# Patient Record
Sex: Male | Born: 1945 | Race: White | Hispanic: No | Marital: Married | Smoking: Former smoker
Health system: Southern US, Community
[De-identification: ages and names within clinical notes are randomized; demographics above are authoritative.]

## PROBLEM LIST (undated history)

## (undated) DIAGNOSIS — E785 Hyperlipidemia, unspecified: Secondary | ICD-10-CM

## (undated) DIAGNOSIS — F419 Anxiety disorder, unspecified: Secondary | ICD-10-CM

## (undated) DIAGNOSIS — F32A Depression, unspecified: Secondary | ICD-10-CM

## (undated) DIAGNOSIS — IMO0001 Reserved for inherently not codable concepts without codable children: Secondary | ICD-10-CM

## (undated) DIAGNOSIS — F329 Major depressive disorder, single episode, unspecified: Secondary | ICD-10-CM

## (undated) DIAGNOSIS — C801 Malignant (primary) neoplasm, unspecified: Secondary | ICD-10-CM

## (undated) DIAGNOSIS — H9319 Tinnitus, unspecified ear: Secondary | ICD-10-CM

## (undated) HISTORY — DX: Tinnitus, unspecified ear: H93.19

## (undated) HISTORY — DX: Depression, unspecified: F32.A

## (undated) HISTORY — DX: Anxiety disorder, unspecified: F41.9

## (undated) HISTORY — DX: Major depressive disorder, single episode, unspecified: F32.9

---

## 2007-09-06 ENCOUNTER — Ambulatory Visit (HOSPITAL_COMMUNITY): Payer: Self-pay | Admitting: Psychiatry

## 2007-09-19 ENCOUNTER — Ambulatory Visit (HOSPITAL_COMMUNITY): Payer: Self-pay | Admitting: Psychology

## 2007-09-26 ENCOUNTER — Ambulatory Visit (HOSPITAL_COMMUNITY): Payer: Self-pay | Admitting: Psychology

## 2007-10-04 ENCOUNTER — Ambulatory Visit (HOSPITAL_COMMUNITY): Payer: Self-pay | Admitting: Psychiatry

## 2007-10-12 ENCOUNTER — Ambulatory Visit (HOSPITAL_COMMUNITY): Payer: Self-pay | Admitting: Psychology

## 2007-10-24 ENCOUNTER — Ambulatory Visit (HOSPITAL_COMMUNITY): Payer: Self-pay | Admitting: Psychology

## 2007-12-06 ENCOUNTER — Ambulatory Visit (HOSPITAL_COMMUNITY): Payer: Self-pay | Admitting: Psychiatry

## 2007-12-19 ENCOUNTER — Ambulatory Visit (HOSPITAL_COMMUNITY): Payer: Self-pay | Admitting: Psychology

## 2008-01-17 ENCOUNTER — Ambulatory Visit (HOSPITAL_COMMUNITY): Payer: Self-pay | Admitting: Psychiatry

## 2008-03-18 ENCOUNTER — Ambulatory Visit (HOSPITAL_COMMUNITY): Payer: Self-pay | Admitting: Psychiatry

## 2008-05-12 ENCOUNTER — Ambulatory Visit (HOSPITAL_COMMUNITY): Payer: Self-pay | Admitting: Psychiatry

## 2008-08-12 ENCOUNTER — Ambulatory Visit (HOSPITAL_COMMUNITY): Payer: Self-pay | Admitting: Psychiatry

## 2008-09-12 ENCOUNTER — Ambulatory Visit: Payer: Self-pay | Admitting: Family Medicine

## 2008-09-12 DIAGNOSIS — K0501 Acute gingivitis, non-plaque induced: Secondary | ICD-10-CM | POA: Insufficient documentation

## 2008-10-30 ENCOUNTER — Ambulatory Visit (HOSPITAL_COMMUNITY): Payer: Self-pay | Admitting: Psychiatry

## 2008-12-29 ENCOUNTER — Ambulatory Visit (HOSPITAL_COMMUNITY): Payer: Self-pay | Admitting: Psychiatry

## 2009-02-20 ENCOUNTER — Ambulatory Visit (HOSPITAL_COMMUNITY): Payer: Self-pay | Admitting: Psychiatry

## 2009-04-23 ENCOUNTER — Ambulatory Visit (HOSPITAL_COMMUNITY): Payer: Self-pay | Admitting: Psychiatry

## 2009-07-09 ENCOUNTER — Ambulatory Visit (HOSPITAL_COMMUNITY): Payer: Self-pay | Admitting: Psychiatry

## 2009-08-06 ENCOUNTER — Ambulatory Visit (HOSPITAL_COMMUNITY): Payer: Self-pay | Admitting: Psychiatry

## 2009-09-04 ENCOUNTER — Ambulatory Visit (HOSPITAL_COMMUNITY): Payer: Self-pay | Admitting: Psychiatry

## 2009-10-27 ENCOUNTER — Ambulatory Visit (HOSPITAL_COMMUNITY): Payer: Self-pay | Admitting: Psychiatry

## 2009-12-09 ENCOUNTER — Ambulatory Visit (HOSPITAL_COMMUNITY): Payer: Self-pay | Admitting: Psychiatry

## 2010-02-02 ENCOUNTER — Ambulatory Visit (HOSPITAL_COMMUNITY): Payer: Self-pay | Admitting: Psychiatry

## 2010-04-26 ENCOUNTER — Encounter (INDEPENDENT_AMBULATORY_CARE_PROVIDER_SITE_OTHER): Payer: BC Managed Care – PPO | Admitting: Psychiatry

## 2010-04-26 DIAGNOSIS — F339 Major depressive disorder, recurrent, unspecified: Secondary | ICD-10-CM

## 2010-04-26 DIAGNOSIS — F411 Generalized anxiety disorder: Secondary | ICD-10-CM

## 2010-06-25 ENCOUNTER — Encounter (INDEPENDENT_AMBULATORY_CARE_PROVIDER_SITE_OTHER): Payer: BC Managed Care – PPO | Admitting: Psychiatry

## 2010-06-25 DIAGNOSIS — F339 Major depressive disorder, recurrent, unspecified: Secondary | ICD-10-CM

## 2010-06-25 DIAGNOSIS — F411 Generalized anxiety disorder: Secondary | ICD-10-CM

## 2010-07-01 ENCOUNTER — Other Ambulatory Visit: Payer: Self-pay | Admitting: Orthopedic Surgery

## 2010-07-01 DIAGNOSIS — M79641 Pain in right hand: Secondary | ICD-10-CM

## 2010-07-02 ENCOUNTER — Ambulatory Visit
Admission: RE | Admit: 2010-07-02 | Discharge: 2010-07-02 | Disposition: A | Discharge status: Home / Self Care | Payer: BC Managed Care – PPO | Source: Ambulatory Visit | Attending: Orthopedic Surgery | Admitting: Orthopedic Surgery

## 2010-07-02 DIAGNOSIS — M79641 Pain in right hand: Secondary | ICD-10-CM

## 2010-09-21 ENCOUNTER — Encounter (INDEPENDENT_AMBULATORY_CARE_PROVIDER_SITE_OTHER): Payer: PRIVATE HEALTH INSURANCE | Admitting: Psychiatry

## 2010-09-21 DIAGNOSIS — F429 Obsessive-compulsive disorder, unspecified: Secondary | ICD-10-CM

## 2010-09-21 DIAGNOSIS — F339 Major depressive disorder, recurrent, unspecified: Secondary | ICD-10-CM

## 2010-11-23 ENCOUNTER — Encounter (INDEPENDENT_AMBULATORY_CARE_PROVIDER_SITE_OTHER): Payer: PRIVATE HEALTH INSURANCE | Admitting: Psychiatry

## 2010-11-23 DIAGNOSIS — F411 Generalized anxiety disorder: Secondary | ICD-10-CM

## 2010-11-23 DIAGNOSIS — F339 Major depressive disorder, recurrent, unspecified: Secondary | ICD-10-CM

## 2011-01-22 ENCOUNTER — Emergency Department (INDEPENDENT_AMBULATORY_CARE_PROVIDER_SITE_OTHER)
Admission: EM | Admit: 2011-01-22 | Discharge: 2011-01-22 | Disposition: A | Discharge status: Home / Self Care | Payer: Medicare Other | Source: Home / Self Care | Attending: Family Medicine | Admitting: Family Medicine

## 2011-01-22 ENCOUNTER — Encounter: Payer: Self-pay | Admitting: Emergency Medicine

## 2011-01-22 DIAGNOSIS — J0100 Acute maxillary sinusitis, unspecified: Secondary | ICD-10-CM

## 2011-01-22 DIAGNOSIS — J069 Acute upper respiratory infection, unspecified: Secondary | ICD-10-CM

## 2011-01-22 HISTORY — DX: Depression, unspecified: F32.A

## 2011-01-22 HISTORY — DX: Hyperlipidemia, unspecified: E78.5

## 2011-01-22 HISTORY — DX: Malignant (primary) neoplasm, unspecified: C80.1

## 2011-01-22 HISTORY — DX: Anxiety disorder, unspecified: F41.9

## 2011-01-22 HISTORY — DX: Reserved for inherently not codable concepts without codable children: IMO0001

## 2011-01-22 HISTORY — DX: Major depressive disorder, single episode, unspecified: F32.9

## 2011-01-22 MED ORDER — BENZONATATE 200 MG PO CAPS: 200.0000 mg | ORAL_CAPSULE | Freq: Every day | ORAL | Status: AC

## 2011-01-22 MED ORDER — AMOXICILLIN 875 MG PO TABS: 875.0000 mg | ORAL_TABLET | Freq: Two times a day (BID) | ORAL | Status: AC

## 2011-01-22 NOTE — ED Notes (Signed)
Sore throat, headache, sinus pain x 4 days. Did have Flu shot 7 days ago. Took Robitussin and Claritan today.

## 2011-01-22 NOTE — ED Provider Notes (Signed)
History     CSN: [id number] Arrival date & time: 01/22/2011 11:19 AM   First MD Initiated Contact with Patient 01/22/11 1119      Chief Complaint  Patient presents with  . Sore Throat    headache, sinus pain x 4 days. Did have Flu shot 7 days ago. Took      HPI Comments: Patient complains of onset of sinus congestion about 3 weeks ago, and over the past 4 days has developed increased sore throat and cough.  He developed chills last night. He had a pneumococcal vaccine injection about two weeks ago and a flu shot 1 week ago  Patient is a 65 y.o. male presenting with URI. The history is provided by the patient.  URI The primary symptoms include fatigue, sore throat and cough. Primary symptoms do not include headaches, ear pain, wheezing, abdominal pain, nausea, arthralgias or rash. The current episode started more than 1 week ago. This is a new problem.  Symptoms associated with the illness include chills, facial pain, sinus pressure, congestion and rhinorrhea.    Past Medical History  Diagnosis Date  . Cancer   . Hyperlipemia   . Anxiety and depression   . White coat hypertension     History reviewed. No pertinent past surgical history.  Family History  Problem Relation Age of Onset  . Cancer Mother   . Hypertension Father   . Hyperlipidemia Father   . Hypertension Brother     History  Substance Use Topics  . Smoking status: Not on file  . Smokeless tobacco: Not on file  . Alcohol Use: No      Review of Systems  Constitutional: Positive for chills and fatigue.  HENT: Positive for congestion, sore throat, rhinorrhea and sinus pressure. Negative for ear pain.   Eyes: Negative.   Respiratory: Positive for cough. Negative for chest tightness, shortness of breath and wheezing.   Gastrointestinal: Negative for nausea and abdominal pain.  Genitourinary: Negative.   Musculoskeletal: Negative for arthralgias.  Skin: Negative for rash.  Neurological: Negative for  headaches.    Allergies  Mevacor  Home Medications   Current Outpatient Rx  Name Route Sig Dispense Refill  . ATENOLOL 25 MG PO TABS Oral Take 25 mg by mouth daily.      . ATORVASTATIN CALCIUM 40 MG PO TABS Oral Take 40 mg by mouth daily.      Marland Kitchen CLONAZEPAM 2 MG PO TABS Oral Take 10 mg by mouth 1 day or 1 dose.      Marland Kitchen ESZOPICLONE 1 MG PO TABS Oral Take 3 mg by mouth at bedtime. Take immediately before bedtime     . FLUOXETINE HCL 20 MG PO CAPS Oral Take 20 mg by mouth daily.      Marland Kitchen LAMOTRIGINE 100 MG PO TABS Oral Take 100 mg by mouth daily.      Marland Kitchen LOSARTAN POTASSIUM-HCTZ 100-12.5 MG PO TABS Oral Take 1 tablet by mouth daily.      . AMOXICILLIN 875 MG PO TABS Oral Take 1 tablet (875 mg total) by mouth 2 (two) times daily. 20 tablet 0  . BENZONATATE 200 MG PO CAPS Oral Take 1 capsule (200 mg total) by mouth at bedtime. 12 capsule 0    BP 165/75  Pulse 60  Temp(Src) 98.4 F (36.9 C) (Oral)  Resp 16  Ht 6\' 2"  (1.88 m)  Wt 244 lb (110.678 kg)  BMI 31.33 kg/m2  SpO2 98%  Physical Exam  Nursing note and  vitals reviewed. Constitutional: He is oriented to person, place, and time. He appears well-developed and well-nourished. No distress.  HENT:  Head: Normocephalic and atraumatic.  Right Ear: Tympanic membrane, external ear and ear canal normal.  Left Ear: Tympanic membrane, external ear and ear canal normal.  Nose: Mucosal edema and rhinorrhea present. Right sinus exhibits maxillary sinus tenderness. Left sinus exhibits maxillary sinus tenderness.  Mouth/Throat: Oropharynx is clear and moist. No oral lesions. No oropharyngeal exudate.  Eyes: Conjunctivae are normal. Pupils are equal, round, and reactive to light. Right eye exhibits no discharge. Left eye exhibits no discharge. No scleral icterus.  Neck: Neck supple.  Cardiovascular: Normal rate, regular rhythm and normal heart sounds.   Pulmonary/Chest: Effort normal and breath sounds normal. He has no wheezes. He has no rales.    Abdominal: Soft. There is no tenderness.  Lymphadenopathy:    He has no cervical adenopathy.  Neurological: He is alert and oriented to person, place, and time.  Skin: Skin is warm and dry.    ED Course  Procedures  None   Labs Reviewed  POCT RAPID STREP A (OFFICE) Negative      1. Acute upper respiratory infections of unspecified site   2. Acute maxillary sinusitis       MDM  Suspect prolonged viral URI with secondary sinusitis  Begin Amoxicillin.  Begin expectorant, topical decongestant, cough suppressant at bedtime.  Increase fluid intake, rest.  Followup with PCP if not improving one week       Donna Christen, MD 01/24/11 2245

## 2011-01-31 ENCOUNTER — Other Ambulatory Visit (HOSPITAL_COMMUNITY): Payer: Self-pay | Admitting: Psychiatry

## 2011-01-31 DIAGNOSIS — F329 Major depressive disorder, single episode, unspecified: Secondary | ICD-10-CM

## 2011-01-31 MED ORDER — LAMOTRIGINE 100 MG PO TABS: 100.0000 mg | ORAL_TABLET | Freq: Every day | ORAL | Status: DC

## 2011-02-07 DIAGNOSIS — I2510 Atherosclerotic heart disease of native coronary artery without angina pectoris: Secondary | ICD-10-CM | POA: Insufficient documentation

## 2011-02-18 ENCOUNTER — Encounter (HOSPITAL_COMMUNITY): Payer: Self-pay

## 2011-02-22 ENCOUNTER — Ambulatory Visit (INDEPENDENT_AMBULATORY_CARE_PROVIDER_SITE_OTHER): Payer: Medicare Other | Admitting: Psychiatry

## 2011-02-22 ENCOUNTER — Encounter (HOSPITAL_COMMUNITY): Payer: Self-pay | Admitting: Psychiatry

## 2011-02-22 VITALS — BP 148/89 | Ht 73.0 in | Wt 248.0 lb

## 2011-02-22 DIAGNOSIS — F329 Major depressive disorder, single episode, unspecified: Secondary | ICD-10-CM

## 2011-02-22 DIAGNOSIS — F410 Panic disorder [episodic paroxysmal anxiety] without agoraphobia: Secondary | ICD-10-CM

## 2011-02-22 MED ORDER — RISPERIDONE 0.25 MG PO TABS: 0.2500 mg | ORAL_TABLET | Freq: Two times a day (BID) | ORAL | Status: AC

## 2011-02-22 NOTE — Progress Notes (Signed)
   [facility] Follow-up Outpatient Visit  Austin Shaffer May 30, 1945   Subjective: The patient is a 65 year old male who has been followed by [facility] since June of 2009. He is currently diagnosed with major depression and panic disorder. He has been on multiple medication trials. He has severe recurrent depression. He is even tried ECT with no effect. The patient comes in today and states that he has had a terrible last 90 days. He's been very aggravated. He hasn't wanted to leave the house. He thinks about dying, but has no suicidal intent. He had a stress test recently secondary to daily fatigue. This was negative. Has continued on his blood pressure medications. Patient states he has enjoyed the holiday since 1989 with his depression started. He sleeps well with Lunesta. Appetite is good.  Filed Vitals:   02/22/11 0959  BP: 148/89    Mental Status Examination  Appearance: Casually dressed Alert: Yes Attention: good  Cooperative: Yes Eye Contact: Good Speech: Regular rate rhythm and volume Psychomotor Activity: Normal Memory/Concentration: Intact Oriented: person, place, time/date and situation Mood: Depressed Affect: Restricted Thought Processes and Associations: Logical Fund of Knowledge: Fair Thought Content: No suicidal or homicidal thoughts Insight: Fair Judgement: Fair  Diagnosis: Maj. 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 The patient has been on Abilify before which cause activation. I will try a quarter milligram of Risperdal twice a day, along with patient's current medications. Patient is to give me an update on Friday. I will see him back in clinic in one month.  Jamse Mead, MD

## 2011-03-25 ENCOUNTER — Encounter (HOSPITAL_COMMUNITY): Payer: Self-pay | Admitting: Psychiatry

## 2011-03-25 ENCOUNTER — Ambulatory Visit (INDEPENDENT_AMBULATORY_CARE_PROVIDER_SITE_OTHER): Payer: Medicare Other | Admitting: Psychiatry

## 2011-03-25 VITALS — BP 128/75 | Ht 73.0 in | Wt 247.0 lb

## 2011-03-25 DIAGNOSIS — F329 Major depressive disorder, single episode, unspecified: Secondary | ICD-10-CM

## 2011-03-25 NOTE — Progress Notes (Signed)
   [facility] Follow-up Outpatient Visit  Austin Shaffer 1945-12-28   Subjective: Patient seen today for followup. At last appointment he was severely depressed and had been for several months. I put him on low dose Risperdal twice a day. The patient reports today that he took it for one week. He states that his bad mood broke. He's been feeling better since. He states that he took the morning dose and didn't have any problems but after he took the evening dose he couldn't sleep at night. He does find that he sits around a little bit more during the day, but he is okay with this. He doesn't feel that he does irritable. He doesn't know if it's the medication or the holidays being over that made him feel better. He is sleeping a little bit better. He is spending time outside and getting things done. Filed Vitals:   03/25/11 1024  BP: 128/75    Mental Status Examination  Appearance: Casual Alert: Yes Attention: good  Cooperative: Yes Eye Contact: Good Speech: Regular rate rhythm and volume Psychomotor Activity: Normal Memory/Concentration: Intact Oriented: person, place, time/date and situation Mood: Euthymic Affect: Restricted Thought Processes and Associations: Logical Fund of Knowledge: Fair Thought Content: No suicidal or homicidal thoughts Insight: Fair Judgement: Fair  Diagnosis: Maj. depressive disorder, recurrent, severe without psychosis  Treatment Plan: At this point we will not continue the Risperdal for now. We will keep it available for when patient hits low moods. We will continue his other medications. I will see him back in 2 months.  Jamse Mead, MD

## 2011-04-15 ENCOUNTER — Other Ambulatory Visit: Payer: Self-pay | Admitting: Orthopedic Surgery

## 2011-04-15 ENCOUNTER — Ambulatory Visit
Admission: RE | Admit: 2011-04-15 | Discharge: 2011-04-15 | Disposition: A | Discharge status: Home / Self Care | Payer: Medicare Other | Source: Ambulatory Visit | Attending: Orthopedic Surgery | Admitting: Orthopedic Surgery

## 2011-04-15 DIAGNOSIS — M25562 Pain in left knee: Secondary | ICD-10-CM

## 2011-04-18 ENCOUNTER — Other Ambulatory Visit (HOSPITAL_COMMUNITY): Payer: Self-pay | Admitting: Psychiatry

## 2011-04-18 DIAGNOSIS — F410 Panic disorder [episodic paroxysmal anxiety] without agoraphobia: Secondary | ICD-10-CM

## 2011-04-18 DIAGNOSIS — F329 Major depressive disorder, single episode, unspecified: Secondary | ICD-10-CM

## 2011-04-18 MED ORDER — FLUOXETINE HCL 20 MG PO CAPS: 60.0000 mg | ORAL_CAPSULE | Freq: Every day | ORAL | Status: DC

## 2011-05-17 ENCOUNTER — Other Ambulatory Visit (HOSPITAL_COMMUNITY): Payer: Self-pay | Admitting: Psychiatry

## 2011-05-17 MED ORDER — CLONAZEPAM 1 MG PO TABS: 1.0000 mg | ORAL_TABLET | Freq: Every evening | ORAL | Status: DC | PRN

## 2011-05-25 ENCOUNTER — Encounter (HOSPITAL_COMMUNITY): Payer: Self-pay | Admitting: Psychiatry

## 2011-05-25 ENCOUNTER — Ambulatory Visit (INDEPENDENT_AMBULATORY_CARE_PROVIDER_SITE_OTHER): Payer: Medicare Other | Admitting: Psychiatry

## 2011-05-25 VITALS — BP 148/78 | Ht 73.0 in | Wt 252.0 lb

## 2011-05-25 DIAGNOSIS — F329 Major depressive disorder, single episode, unspecified: Secondary | ICD-10-CM

## 2011-05-25 NOTE — Progress Notes (Signed)
   [facility] Follow-up Outpatient Visit  KALIEB FREELAND June 02, 1945   Subjective: The patient is a 66 year old male who has been treated by [facility] since June of 2009. He is currently diagnosed with major depressive disorder, recurrent, moderate. Patient seen today for followup. The patient reports he continues to the door of the house. He still has pretty continuous back pain, and therefore has not been doing much physical work. He did have a injection of steroids into his knee for his pain, but it did not seem to help. The patient has been watching his food intake. He is eating healthier, but still tends to overeat. He has not had any more issues with his neighbor. His neighbor did set up cameras on the perimeter of the property that the patient has been not near them. The patient reports he hasn't been sleeping well for about 3-4 weeks. He has changed his medicine a little bit where he moved his Prozac to 3:00 in the afternoon from the morning. He also did this about 3 weeks ago. The patient's complaint that easy bruising. He states he had blood work and his primary care physician's approximately a week ago and everything was fine. Filed Vitals:   05/25/11 1343  BP: 148/78    Mental Status Examination  Appearance: Casual Alert: Yes Attention: good  Cooperative: Yes Eye Contact: Good Speech: Regular rate rhythm and volume Psychomotor Activity: Normal Memory/Concentration: Intact Oriented: person, place, time/date and situation Mood: Euthymic Affect: Restricted Thought Processes and Associations: Logical Fund of Knowledge: Fair Thought Content: No suicidal or homicidal thoughts Insight: Fair Judgement: Fair  Diagnosis: Maj. depressive disorder, recurrent, severe without psychosis  Treatment Plan: At this point we will not continue the Risperdal for now. We will keep it available for when patient hits low moods. We will continue his other medications. I  will see him back in 2 months.  Jamse Mead, MD

## 2011-07-05 ENCOUNTER — Other Ambulatory Visit (HOSPITAL_COMMUNITY): Payer: Self-pay | Admitting: Psychiatry

## 2011-07-05 MED ORDER — ESZOPICLONE 1 MG PO TABS: 3.0000 mg | ORAL_TABLET | Freq: Every day | ORAL | Status: DC

## 2011-07-19 ENCOUNTER — Telehealth (HOSPITAL_COMMUNITY): Payer: Self-pay

## 2011-07-19 MED ORDER — CLONAZEPAM 1 MG PO TABS: 1.0000 mg | ORAL_TABLET | Freq: Two times a day (BID) | ORAL | Status: DC | PRN

## 2011-07-19 NOTE — Telephone Encounter (Signed)
Is only taking 2 prozac a day now and taking 2 klonopin a day one qam and qpm. Feels this is working better. Is this ok...Marland KitchenMarland Kitchen

## 2011-07-19 NOTE — Telephone Encounter (Signed)
I am fine with it

## 2011-07-25 ENCOUNTER — Encounter (HOSPITAL_COMMUNITY): Payer: Self-pay | Admitting: Psychiatry

## 2011-07-25 ENCOUNTER — Ambulatory Visit (INDEPENDENT_AMBULATORY_CARE_PROVIDER_SITE_OTHER): Payer: PRIVATE HEALTH INSURANCE | Admitting: Psychiatry

## 2011-07-25 VITALS — BP 142/78 | Ht 73.0 in | Wt 253.0 lb

## 2011-07-25 DIAGNOSIS — F329 Major depressive disorder, single episode, unspecified: Secondary | ICD-10-CM

## 2011-07-25 DIAGNOSIS — F332 Major depressive disorder, recurrent severe without psychotic features: Secondary | ICD-10-CM

## 2011-07-25 NOTE — Progress Notes (Signed)
   Austin Shaffer Follow-up Outpatient Visit  Austin Shaffer 1946-01-22   Subjective: The patient is a 66 year old male who has been treated by [facility] since June of 2009. He is currently diagnosed with major depressive disorder, recurrent, moderate. Patient seen today for followup. At his last appointment, we discontinued his Risperdal, which seem to get him out of his funk. He was advised to keep it in the back cupboard in case he needed it. The patient called on 07/19/2011 and stated that he been taking Klonopin twice a day rather than once a day. He felt that it was helping him with his depression. He reports today that he hasn't taken the morning Klonopin for 2 days. It was making him tired. He still continues to take to Prozac in the morning. His depression is better, but he still doesn't feel a lot of motivation. This morning he only took half of his Lamictal. I have counseled him on changing his medication without discussing with me. He is sleeping better. He has stopped taking NyQuil at night. He states that he has a lot to do, but can't seem to get it done. He has not [employer]. He states that he works more spur of the moment. He denies any thoughts of self-harm. Filed Vitals:   07/25/11 1020  BP: 142/78    Mental Status Examination  Appearance: Casual Alert: Yes Attention: good  Cooperative: Yes Eye Contact: Good Speech: Regular rate rhythm and volume Psychomotor Activity: Normal Memory/Concentration: Intact Oriented: person, place, time/date and situation Mood: Euthymic Affect: Restricted Thought Processes and Associations: Logical Fund of Knowledge: Fair Thought Content: No suicidal or homicidal thoughts Insight: Fair Judgement: Fair  Diagnosis: Maj. depressive disorder, recurrent, severe without psychosis  Treatment Plan: I have recommended the patient take one half Klonopin in the morning. I want him taking a full and mental. We will  continue all of her medication. I will see him back in 2 months.  Jamse Mead, MD

## 2011-09-26 ENCOUNTER — Ambulatory Visit (HOSPITAL_COMMUNITY): Payer: Self-pay | Admitting: Psychiatry

## 2011-09-27 ENCOUNTER — Encounter (HOSPITAL_COMMUNITY): Payer: Self-pay | Admitting: Psychiatry

## 2011-09-27 ENCOUNTER — Ambulatory Visit (INDEPENDENT_AMBULATORY_CARE_PROVIDER_SITE_OTHER): Payer: Medicare Other | Admitting: Psychiatry

## 2011-09-27 VITALS — BP 138/80 | Ht 73.0 in | Wt 256.0 lb

## 2011-09-27 DIAGNOSIS — F332 Major depressive disorder, recurrent severe without psychotic features: Secondary | ICD-10-CM

## 2011-09-27 DIAGNOSIS — F329 Major depressive disorder, single episode, unspecified: Secondary | ICD-10-CM

## 2011-09-27 MED ORDER — ESZOPICLONE 3 MG PO TABS: 3.0000 mg | ORAL_TABLET | Freq: Every day | ORAL | Status: DC

## 2011-09-27 NOTE — Progress Notes (Signed)
   [facility] Follow-up Outpatient Visit  Austin Shaffer 1945-08-18   Subjective: The patient is a 66 year old male who has been treated by [facility] since June of 2009. He is currently diagnosed with major depressive disorder, recurrent, moderate. Patient is seen today for followup. At his last appointment, I did not make any true changes. I suggested that he take one half Klonopin in the morning and a full one at bedtime. I also wished him to to continue his full Lamictal daily. He presents today alone. He has been having injections in his back, but they don't seem to help. He reports his main pain issues in his left calf. He wants to get scan because he has read about DVTs. He reports his depression as 7/10. He endorses increased anxiety. The patient discusses some issues with his blood pressure medication. He reports that approximately one hour after he takes it, he feels like his blood pressure bottoms out and he has to lay down for a while. His wife will check it during this time, and is normal. The patient is worried about his grandson. His grandson is 45 years old and started to act out. He reacts poorly to the word no. The patient thinks he is going somewhere to be seen. Filed Vitals:   09/27/11 1111  BP: 138/80    Mental Status Examination  Appearance: Casual Alert: Yes Attention: good  Cooperative: Yes Eye Contact: Good Speech: Regular rate rhythm and volume Psychomotor Activity: Normal Memory/Concentration: Intact Oriented: person, place, time/date and situation Mood: Euthymic Affect: Restricted Thought Processes and Associations: Logical Fund of Knowledge: Fair Thought Content: No suicidal or homicidal thoughts Insight: Fair Judgement: Fair  Diagnosis: Maj. depressive disorder, recurrent, severe without psychosis  Treatment Plan: I will not make any changes today. Patient is to take the full Lamictal daily. He is also to take his Klonopin one  half in the morning and one at bedtime. We will continue the Prozac at 20 mg daily. I have suggested to go up on it, but patient is hesitant regarding this. He reports in the past to make him more anxious. I will check on him in 2 months.  Jamse Mead, MD

## 2011-11-29 ENCOUNTER — Ambulatory Visit (INDEPENDENT_AMBULATORY_CARE_PROVIDER_SITE_OTHER): Payer: Medicare Other | Admitting: Psychiatry

## 2011-11-29 ENCOUNTER — Encounter (HOSPITAL_COMMUNITY): Payer: Self-pay | Admitting: Psychiatry

## 2011-11-29 VITALS — BP 122/78 | Ht 73.0 in | Wt 260.0 lb

## 2011-11-29 DIAGNOSIS — F329 Major depressive disorder, single episode, unspecified: Secondary | ICD-10-CM

## 2011-11-29 DIAGNOSIS — F332 Major depressive disorder, recurrent severe without psychotic features: Secondary | ICD-10-CM

## 2011-11-29 NOTE — Progress Notes (Addendum)
   [facility] Follow-up Outpatient Visit  Austin Shaffer 1945/11/20   Subjective: The patient is a 66 year old male who has been treated by [facility] since June of 2009. He is currently diagnosed with major depressive disorder, recurrent, moderate. Patient is seen today for followup. At his last appointment, I did not make any changes. She presents today. He still having issues with lower extremity weakness on the left. He's had 4 injections in his back. His most recent MRI of his lumbar spine showed a ruptured disc. The patient feels that his grandson is behaving little bit better. The patient reports socially isolating, but his wife will be social without him. He feels that his anxiety continues to be bad. He is increased his Prozac to 40 mg daily. He describes aepisode where he feels week after he takes it that is very similar to what he was describing with his blood pressure medication at the last visit. He reports today is having a good day, and will probably get things done. Patient reports his only taking one Klonopin at bedtime. He is asking for something other than Lunesta for sleep. Filed Vitals:   11/29/11 1042  BP: 122/78    Mental Status Examination  Appearance: Casual Alert: Yes Attention: good  Cooperative: Yes Eye Contact: Good Speech: Regular rate rhythm and volume Psychomotor Activity: Normal Memory/Concentration: Intact Oriented: person, place, time/date and situation Mood: Euthymic Affect: Restricted Thought Processes and Associations: Logical Fund of Knowledge: Fair Thought Content: No suicidal or homicidal thoughts Insight: Fair Judgement: Fair  Diagnosis: Maj. depressive disorder, recurrent, severe without psychosis  Treatment Plan: I will continue the Prozac at 40 mg daily. I suggested that he split it and take 20 in the morning and 20 at bedtime. I will continue the full Lamictal, and the Klonopin. I have suggested the patient try  taking 1-1/2 Klonopin at bedtime to help with sleep. I will see him back in 2 months.  Jamse Mead, MD

## 2012-01-30 ENCOUNTER — Encounter (HOSPITAL_COMMUNITY): Payer: Self-pay | Admitting: Psychiatry

## 2012-01-30 ENCOUNTER — Ambulatory Visit (INDEPENDENT_AMBULATORY_CARE_PROVIDER_SITE_OTHER): Payer: Medicare Other | Admitting: Psychiatry

## 2012-01-30 VITALS — BP 128/82 | Ht 73.0 in | Wt 258.0 lb

## 2012-01-30 DIAGNOSIS — F332 Major depressive disorder, recurrent severe without psychotic features: Secondary | ICD-10-CM

## 2012-01-30 NOTE — Progress Notes (Signed)
   [facility] Follow-up Outpatient Visit  Austin Shaffer [birth year]  The patient is a 66 year old man who has been followed by [facility] since June of 2009. He is currently diagnosed with Maj. depressive disorder, recurrent, severe. At his last appointment, I discontinued his Alfonso Patten and allow him to take 1-1/2 Klonopin at bedtime. He presents today alone. He has been using the Klonopin at bedtime. He is currently sleeping 6 hours. He is pleased with this. He is down 2 pounds today. He is walking 3 miles a day. It gets him out of the house. He is no longer having back issues, but is still having issues with his left leg. He had a carotid ultrasound, which showed less than 50% blockage. His primary care physician her heart murmur. He was referred to a cardiologist he did not hear the murmur. The patient is asking if he can take any Klonopin in the morning to help with anxiety. He continues to take his Lamictal and his Prozac as prescribed. He is off the [place]. He feels that his mood is doing pretty well today. It's always up and down.  Mental Status Examination  Appearance: Casual Alert: Yes Attention: good  Cooperative: Yes Eye Contact: Good Speech: Regular rate rhythm and volume Psychomotor Activity: Normal Memory/Concentration: Intact Oriented: person, place, time/date and situation Mood: Euthymic Affect: Restricted Thought Processes and Associations: Logical Fund of Knowledge: Fair Thought Content: No suicidal or homicidal thoughts Insight: Fair Judgement: Fair  Diagnosis: Maj. depressive disorder, recurrent, severe without psychosis  Treatment Plan: I will continue the Prozac and the Lamictal. Patient may take one half Klonopin in the morning along with his 1-1/2 at bedtime. I will see him back in 3 months. He may call with concerns.  Jamse Mead, MD

## 2012-04-03 ENCOUNTER — Other Ambulatory Visit (HOSPITAL_COMMUNITY): Payer: Self-pay | Admitting: Psychiatry

## 2012-04-03 DIAGNOSIS — F329 Major depressive disorder, single episode, unspecified: Secondary | ICD-10-CM

## 2012-04-03 MED ORDER — LAMOTRIGINE 100 MG PO TABS: 100.0000 mg | ORAL_TABLET | Freq: Every day | ORAL | Status: DC

## 2012-04-03 MED ORDER — CLONAZEPAM 1 MG PO TABS: 1.0000 mg | ORAL_TABLET | Freq: Two times a day (BID) | ORAL | Status: DC | PRN

## 2012-05-01 ENCOUNTER — Ambulatory Visit (INDEPENDENT_AMBULATORY_CARE_PROVIDER_SITE_OTHER): Payer: Medicare Other | Admitting: Psychiatry

## 2012-05-01 ENCOUNTER — Encounter (HOSPITAL_COMMUNITY): Payer: Self-pay | Admitting: Psychiatry

## 2012-05-01 VITALS — BP 120/78 | Ht 73.0 in | Wt 257.0 lb

## 2012-05-01 DIAGNOSIS — F410 Panic disorder [episodic paroxysmal anxiety] without agoraphobia: Secondary | ICD-10-CM

## 2012-05-01 DIAGNOSIS — F332 Major depressive disorder, recurrent severe without psychotic features: Secondary | ICD-10-CM

## 2012-05-01 NOTE — Progress Notes (Signed)
   [facility] Follow-up Outpatient Visit  Austin Shaffer 01-28-1946  The patient is a 67 year old man who has been followed by [facility] since June of 2009. He is currently diagnosed with Maj. depressive disorder, recurrent, severe. At his last appointment, I added a half tablet of Klonopin in the morning to help get him through the day. I continued his other medications. The patient presents today. He is actually doing well. He feels that since he sleeping better, everything else is better. He moved his blood pressure medication to the evening. He felt it was making him sleepy during the day. He is now sleeping 6-7 hours at night. He has not started his morning Klonopin. He feels like he is doing well without it. Every now and then he'll think of taking it, but changed his mind. He is walking daily one to one and a half hours. He actually got out and played in the snow with his grandson. He denies any suicidal thoughts for 3-4 months. He states he's never really had a true plan. The thoughts are intrusive but he can push them away.  Filed Vitals:   05/01/12 1005  BP: 120/78   Mental Status Examination  Appearance: Casual Alert: Yes Attention: good  Cooperative: Yes Eye Contact: Good Speech: Regular rate rhythm and volume Psychomotor Activity: Normal Memory/Concentration: Intact Oriented: person, place, time/date and situation Mood: Euthymic Affect: Restricted Thought Processes and Associations: Logical Fund of Knowledge: Fair Thought Content: No suicidal or homicidal thoughts Insight: Fair Judgement: Fair  Diagnosis: Maj. 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 I will see him back in 3 months. He may call with concerns.  Jamse Mead, MD

## 2012-07-30 ENCOUNTER — Encounter (HOSPITAL_COMMUNITY): Payer: Self-pay | Admitting: Psychiatry

## 2012-07-30 ENCOUNTER — Ambulatory Visit (INDEPENDENT_AMBULATORY_CARE_PROVIDER_SITE_OTHER): Payer: Medicare Other | Admitting: Psychiatry

## 2012-07-30 VITALS — BP 128/80 | Ht 73.0 in | Wt 256.0 lb

## 2012-07-30 DIAGNOSIS — F410 Panic disorder [episodic paroxysmal anxiety] without agoraphobia: Secondary | ICD-10-CM

## 2012-07-30 DIAGNOSIS — F332 Major depressive disorder, recurrent severe without psychotic features: Secondary | ICD-10-CM

## 2012-07-30 NOTE — Progress Notes (Signed)
[facility] Follow-up Outpatient Visit  Austin Shaffer 01/15/1946  The patient is a 67 year old man who has been followed by [facility] since June of 2009. He is currently diagnosed with Maj. depressive disorder, recurrent, severe along with panic disorder. At his last appointment, I did not make any changes. He presents today. The patient continues to walk daily. He feels that he's had increased appetite, but he is down 1 pound. His wife Lupita Leash had cataract surgery. She has been recovering for the past 6 weeks. She had a lot of pain with the first it. The patient was not sympathetic, and actually became annoyed. He did apologize to her later. He reports he's been better for the last week. His neighbor has started things up again. His neighbor his walking his dog in the patient's yard. The patient is frustrated by this. The neighbor lives on [place] acres, the patient is sleeping okay. He has cut back on his bedtime Klonopin to 1 at bedtime. He was oversedated with one and a half. He reports he is now currently taking to Prozac, and one Lamictal. We discussed that the opportunity for using TMR may be coming to the [place] office. If it does, the patient is interested.  Filed Vitals:   07/30/12 1044  BP: 128/80   Active Ambulatory Problems    Diagnosis Date Noted  . ACUTE GINGIVITIS NONPLAQUE INDUCED 09/12/2008  . MDD (major depressive disorder) 03/25/2011   Resolved Ambulatory Problems    Diagnosis Date Noted  . No Resolved Ambulatory Problems   Past Medical History  Diagnosis Date  . Cancer   . Hyperlipemia   . Anxiety and depression   . White coat hypertension   . Anxiety   . Depression    Current Outpatient Prescriptions on File Prior to Visit  Medication Sig Dispense Refill  . atenolol (TENORMIN) 25 MG tablet Take 25 mg by mouth daily.        Marland Kitchen atorvastatin (LIPITOR) 40 MG tablet Take 40 mg by mouth daily.        . clonazePAM (KLONOPIN) 1 MG tablet  Take 1 tablet (1 mg total) by mouth 2 (two) times daily as needed for anxiety.  180 tablet  1  . FLUoxetine (PROZAC) 20 MG capsule Take 3 capsules (60 mg total) by mouth daily.  270 capsule  3  . lamoTRIgine (LAMICTAL) 100 MG tablet Take 1 tablet (100 mg total) by mouth daily.  90 tablet  3  . losartan-hydrochlorothiazide (HYZAAR) 100-12.5 MG per tablet Take 1 tablet by mouth daily.         No current facility-administered medications on file prior to visit.   Review of Systems - General ROS: negative for - sleep disturbance or weight gain Psychological ROS: negative for - anxiety or depression Cardiovascular ROS: no chest pain or dyspnea on exertion Musculoskeletal ROS: negative for - gait disturbance or muscular weakness Neurological ROS: negative for - dizziness, headaches or seizures  Mental Status Examination  Appearance: Casual Alert: Yes Attention: good  Cooperative: Yes Eye Contact: Good Speech: Regular rate rhythm and volume Psychomotor Activity: Normal Memory/Concentration: Intact Oriented: person, place, time/date and situation Mood: Euthymic Affect: Restricted Thought Processes and Associations: Logical Fund of Knowledge: Fair Thought Content: No suicidal or homicidal thoughts Insight: Fair Judgement: Fair  Diagnosis: Maj. 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 I will see him back in 3  months. He may call with concerns.  Jamse Mead, MD

## 2012-09-09 ENCOUNTER — Other Ambulatory Visit (HOSPITAL_COMMUNITY): Payer: Self-pay | Admitting: Psychiatry

## 2012-11-01 ENCOUNTER — Encounter (HOSPITAL_COMMUNITY): Payer: Self-pay | Admitting: Psychiatry

## 2012-11-01 ENCOUNTER — Ambulatory Visit (INDEPENDENT_AMBULATORY_CARE_PROVIDER_SITE_OTHER): Payer: Medicare Other | Admitting: Psychiatry

## 2012-11-01 VITALS — BP 152/90 | Ht 73.0 in | Wt 256.0 lb

## 2012-11-01 DIAGNOSIS — F410 Panic disorder [episodic paroxysmal anxiety] without agoraphobia: Secondary | ICD-10-CM

## 2012-11-01 DIAGNOSIS — F332 Major depressive disorder, recurrent severe without psychotic features: Secondary | ICD-10-CM
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
## 2012-12-21 ENCOUNTER — Other Ambulatory Visit (HOSPITAL_COMMUNITY): Payer: Self-pay | Admitting: Psychiatry

## 2013-02-01 ENCOUNTER — Encounter (HOSPITAL_COMMUNITY): Payer: Self-pay | Admitting: Psychiatry

## 2013-02-01 ENCOUNTER — Ambulatory Visit (INDEPENDENT_AMBULATORY_CARE_PROVIDER_SITE_OTHER): Payer: Medicare Other | Admitting: Psychiatry

## 2013-02-01 ENCOUNTER — Encounter (INDEPENDENT_AMBULATORY_CARE_PROVIDER_SITE_OTHER): Payer: Self-pay

## 2013-02-01 VITALS — BP 150/85 | HR 87 | Ht 73.0 in | Wt 254.0 lb

## 2013-02-01 DIAGNOSIS — F332 Major depressive disorder, recurrent severe without psychotic features: Secondary | ICD-10-CM

## 2013-02-01 DIAGNOSIS — F410 Panic disorder [episodic paroxysmal anxiety] without agoraphobia: Secondary | ICD-10-CM

## 2013-02-01 DIAGNOSIS — F329 Major depressive disorder, single episode, unspecified: Secondary | ICD-10-CM

## 2013-02-01 MED ORDER — LAMOTRIGINE 100 MG PO TABS: 100.0000 mg | ORAL_TABLET | Freq: Every day | ORAL | Status: DC

## 2013-02-01 MED ORDER — FLUOXETINE HCL 20 MG PO CAPS: 40.0000 mg | ORAL_CAPSULE | Freq: Every day | ORAL | Status: DC

## 2013-02-01 NOTE — Progress Notes (Signed)
[facility] [id number] Progress Note  QUAMERE MUSSELL [id number] 67 y.o.  02/01/2013 3:13 PM  Chief Complaint: Follow up.   History of Present Illness: HPI Comments: Mr. Bady is  a 67 y/o male with a past psychiatric history significant for Maj. 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 The patient is referred for psychiatric services for psychiatric evaluation and medication management.    . Location: Patient continues to have symptoms of anxiety. . Quality:   In the area of affective symptoms, patient appears anxious. Patient denies current suicidal ideation, intent, or plan. Patient denies current homicidal ideation, intent, or plan. Patient denies auditory hallucinations. Patient denies visual hallucinations. Patient denies symptoms of paranoia. Patient states sleep is poor.  Appetite is fair. Energy level is fair. Patient endorses/denies symptoms of anhedonia. Patient endorses/denies hopelessness, helplessness, or guilt.   . Severity: Depression: 5/10 (0=Very depressed; 5=Neutral; 10=Very Happy)  Anxiety-5-10 /10 (0=no anxiety; 5= moderate/tolerable anxiety; 10= panic attacks)  . Duration: More than 5 years.  . Timing: worse when meeting new people  . Context: New people, death of brother.  . Modifying factors: Being home  . Associated signs and symptoms: As noted in psychiatric ROS.  Suicidal Ideation: Negative Plan Formed: Negative Patient has means to carry out plan: Negative  Homicidal Ideation: Negative Plan Formed: Negative Patient has means to carry out plan: Negative  Review of Systems: Psychiatric: Agitation: Yes Hallucination: Negative Depressed Mood: Yes Insomnia: No Hypersomnia: No Altered Concentration: No Feels Worthless: No Grandiose Ideas: No Belief In Special Powers: No New/Increased Substance Abuse: No Compulsions: No  Neurologic: Headache: Negative Seizure: Negative Paresthesias:  Negative  Past Medical Family, Social History:  Past Medical History  Diagnosis Date  . Cancer   . Hyperlipemia   . Anxiety and depression   . White coat hypertension   . Anxiety   . Depression    Family History  Problem Relation Age of Onset  . Cancer Mother   . Hypertension Father   . Hyperlipidemia Father   . Hypertension Brother    History   Social History  . Marital Status: Married    Spouse Name: N/A    Number of Children: N/A  . Years of Education: N/A   Occupational History  . Not on file.   Social History Main Topics  . Smoking status: Never Smoker   . Smokeless tobacco: Not on file  . Alcohol Use: No  . Drug Use: No  . Sexual Activity: Not on file   Other Topics Concern  . Not on file   Social History Narrative  . No narrative on file     Outpatient Encounter Prescriptions as of 02/01/2013  Medication Sig  . atenolol (TENORMIN) 25 MG tablet Take 25 mg by mouth daily.    Marland Kitchen atorvastatin (LIPITOR) 40 MG tablet Take 40 mg by mouth daily.    . clonazePAM (KLONOPIN) 1 MG tablet TAKE 1 TABLET BY MOUTH TWICE A DAY  . FLUoxetine (PROZAC) 20 MG capsule TAKE 3 CAPSULES (60 MG TOTAL) BY MOUTH DAILY.  Marland Kitchen lamoTRIgine (LAMICTAL) 100 MG tablet Take 1 tablet (100 mg total) by mouth daily.  Marland Kitchen losartan-hydrochlorothiazide (HYZAAR) 100-12.5 MG per tablet Take 1 tablet by mouth daily.      Past Psychiatric History/Hospitalization(s): Anxiety: Negative Bipolar Disorder: Negative Depression: Negative Mania: Negative Psychosis: Negative Schizophrenia: Negative Personality Disorder: Negative Hospitalization for psychiatric illness: No History of Electroconvulsive Shock Therapy: Yes Prior Suicide Attempts: Negative  Physical Exam: Constitutional:  Filed Vitals:   02/01/13 1520  Weight: 254 lb (115.214 kg)    General Appearance: alert, oriented, no acute distress and well nourished  Musculoskeletal: Strength & Muscle Tone: within normal limits Gait &  Station: normal Patient leans: Right  Psychiatric: General Appearance: Negative  Eye Contact::  Good  Speech:  Clear and Coherent and Normal Rate  Volume:  Normal  Mood:  "all right" Depression: 5/10 (0=Very depressed; 5=Neutral; 10=Very Happy)  Anxiety- 5-10/10 (0=no anxiety; 5= moderate/tolerable anxiety; 10= panic attacks)   Affect:  Appropriate, Congruent and Full Range  Thought Process:  Coherent, Linear and Logical  Orientation:  Full (Time, Place, and Person)  Thought Content:  WDL  Suicidal Thoughts:  No  Homicidal Thoughts:  No  Memory:  Immediate;   Good Recent;   Good Remote;   Good  Judgement:  Good  Insight:  Fair  Psychomotor Activity:  Normal  Concentration:  Poor  Recall:  Fair  Akathisia:  Yes  Handed:  Right  AIMS (if indicated):     Assets:  Communication Skills Desire for Improvement Financial Resources/Insurance     Assessment: Axis I: Major depressive disorder, recurrent, severe, Panic disorder   Plan:   Plan of Care:  PLAN:  1. Affirm with the patient that the medications are taken as ordered. Patient  expressed understanding of how their medications were to be used.    Laboratory:  No labs warranted at this time.    Psychotherapy: Therapy: brief supportive therapy provided.  Discussed psychosocial stressors in detail.    Medications:  Continue the following psychiatric medications as written prior to this appointment with the following changes::  a) clonazePAM (KLONOPIN) 1 MG tablet b) FLUoxetine (PROZAC) 20 MG capsule c) lamoTRIgine (LAMICTAL) 100 MG tablet  -Risks and benefits, side effects and alternatives discussed with patient, he/ was given an opportunity to ask questions about his/her medication, illness, and treatment. All current psychiatric medications have been reviewed and discussed with the patient and adjusted as clinically appropriate. The patient has been provided an accurate and updated list of the medications being now  prescribed.   Routine PRN Medications:  Negative  Consultations: The patient was encouraged to keep all PCP and specialty clinic appointments.   Safety Concerns:   Patient told to call clinic if any problems occur. Patient advised to go to  ER  if she should develop SI/HI, side effects, or if symptoms worsen. Has crisis numbers to call if needed.    Other:   8. Patient was instructed to return to clinic in  2 months.  9. The patient was advised to call and cancel their mental health appointment within 24 hours of the appointment, if they are unable to keep the appointment, as well as the three no show and termination from clinic policy. 10. The patient expressed understanding of the plan and agrees with the above.    Jacqulyn Cane, MD 02/01/2013

## 2013-02-05 ENCOUNTER — Encounter (HOSPITAL_COMMUNITY): Payer: Self-pay | Admitting: Psychiatry

## 2013-02-21 ENCOUNTER — Ambulatory Visit (INDEPENDENT_AMBULATORY_CARE_PROVIDER_SITE_OTHER): Payer: Medicare Other | Admitting: Psychiatry

## 2013-02-21 VITALS — BP 134/54 | HR 58 | Ht 73.23 in | Wt 248.0 lb

## 2013-02-21 DIAGNOSIS — F410 Panic disorder [episodic paroxysmal anxiety] without agoraphobia: Secondary | ICD-10-CM

## 2013-02-21 DIAGNOSIS — F332 Major depressive disorder, recurrent severe without psychotic features: Secondary | ICD-10-CM

## 2013-02-21 NOTE — Progress Notes (Signed)
Patient ID: Austin Shaffer, male   DOB: 23-Mar-1945, 67 y.o.   MRN: [id number]  [facility]   RAPHAEL FITZPATRICK [id number] 67 y.o.  02/21/2013 1:35 PM  Chief Complaint: I am here to be able to get TMS   History of Present Illness: HPI Comments: Austin Shaffer is  a 67 y/o male with a past psychiatric history significant for Maj. 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 In the area of affective symptoms, patient appears anxious. Patient denies current suicidal ideation, intent, or plan. Patient denies current homicidal ideation, intent, or plan. Patient denies auditory hallucinations. Patient denies visual hallucinations. Patient denies symptoms of paranoia. Patient states sleep is poor.  Appetite is fair. Energy level is fair. Patient endorses/denies symptoms of anhedonia. Patient endorses/denies hopelessness, helplessness, or guilt.   . Severity: Depression: 8/10 (0=Very depressed; 5=Neutral; 10=Very Happy)  Anxiety-6 /10 (0=no anxiety; 5= moderate/tolerable anxiety; 10= panic attacks)  . Duration: More than 5 years.  . Timing: worse when meeting new people  . Context: New people, death of brother.  . Modifying factors: Being home  . Associated signs and symptoms: As noted in psychiatric ROS.  Suicidal Ideation: Negative Plan Formed: Negative Patient has means to carry out plan: Negative  Homicidal Ideation: Negative Plan Formed: Negative Patient has means to carry out plan: Negative  Review of Systems: Psychiatric: Agitation: Yes Hallucination: Negative Depressed Mood: Yes Insomnia: No Hypersomnia: No Altered Concentration: No Feels Worthless: No Grandiose Ideas: No Belief In Special Powers: No New/Increased Substance Abuse: No Compulsions: No  Neurologic: Headache: Negative Seizure: Negative Paresthesias: Negative  Past Medical Family, Social History:  Past Medical History  Diagnosis Date  . Cancer   .  Hyperlipemia   . Anxiety and depression   . White coat hypertension   . Anxiety   . Depression    Family History  Problem Relation Age of Onset  . Cancer Mother   . Hypertension Father   . Hyperlipidemia Father   . Hypertension Brother    History   Social History  . Marital Status: Married    Spouse Name: N/A    Number of Children: N/A  . Years of Education: N/A   Occupational History  . Not on file.   Social History Main Topics  . Smoking status: Never Smoker   . Smokeless tobacco: Not on file  . Alcohol Use: No  . Drug Use: No  . Sexual Activity: Not on file   Other Topics Concern  . Not on file   Social History Narrative  . No narrative on file   Past psychiatric medications: Celexa, Lexapro, Zoloft, Remeron, Effexor, Wellbutrin, Abilify, Seroquel, trazodone, Cymbalta  Outpatient Encounter Prescriptions as of 02/21/2013  Medication Sig  . amLODipine (NORVASC) 2.5 MG tablet   . atenolol (TENORMIN) 25 MG tablet Take 25 mg by mouth daily.    Marland Kitchen atorvastatin (LIPITOR) 40 MG tablet Take 40 mg by mouth daily.    . clonazePAM (KLONOPIN) 1 MG tablet TAKE 1 TABLET BY MOUTH DAY  . FLUoxetine (PROZAC) 20 MG capsule Take 2 capsules (40 mg total) by mouth daily.  . fluticasone (FLONASE) 50 MCG/ACT nasal spray   . gabapentin (NEURONTIN) 300 MG capsule   . HYDROcodone-acetaminophen (NORCO) 10-325 MG per tablet   . lamoTRIgine (LAMICTAL) 100 MG tablet Take 1 tablet (100 mg total) by mouth daily.  Marland Kitchen losartan-hydrochlorothiazide (HYZAAR) 100-12.5 MG per tablet Take 1 tablet by mouth daily.      Past  Psychiatric History/Hospitalization(s): Anxiety: Negative Bipolar Disorder: Negative Depression: Negative Mania: Negative Psychosis: Negative Schizophrenia: Negative Personality Disorder: Negative Hospitalization for psychiatric illness: No History of Electroconvulsive Shock Therapy: Yes Prior Suicide Attempts: Negative  Physical Exam: Constitutional: Filed Vitals:    02/21/13 1125  BP: 134/54  Pulse: 58  Height: 6' 1.23" (1.86 m)  Weight: 248 lb (112.492 kg)    General Appearance: alert, oriented, no acute distress and well nourished  Musculoskeletal: Strength & Muscle Tone: within normal limits Gait & Station: normal Patient leans: Right  Psychiatric: General Appearance: Negative  Eye Contact::  Good  Speech:  Clear and Coherent and Normal Rate  Volume:  Normal  Mood:  "all right" Depression: 8/10 (0=Very depressed; 5=Neutral; 10=Very Happy)  Anxiety- 6/10 (0=no anxiety; 5= moderate/tolerable anxiety; 10= panic attacks)   Affect:  Appropriate, Congruent and Full Range  Thought Process:  Coherent, Linear and Logical  Orientation:  Full (Time, Place, and Person)  Thought Content:  WDL  Suicidal Thoughts:  No  Homicidal Thoughts:  No  Memory:  Immediate;   Good Recent;   Good Remote;   Good  Judgement:  Good  Insight:  Fair  Psychomotor Activity:  Normal  Concentration:  Poor  Recall:  Fair  Akathisia:  Yes  Handed:  Right  AIMS (if indicated):     Assets:  Communication Skills Desire for Improvement Financial Resources/Insurance     Assessment: Axis I: Major depressive disorder, recurrent, severe, Panic disorder   Plan:   Plan of Care:  PLAN:  1. patient to continue his current medications 2. Information completed to get authorization for TMS 3. TMS discussed in length with the patient and his wife at this visit   Laboratory:  No labs warranted at this time.    Psychotherapy: Therapy: brief supportive therapy provided.  Discussed psychosocial stressors in detail.    Medications:  Continue the following psychiatric medications as written prior to this appointment with the following changes::  a) clonazePAM (KLONOPIN) 1 MG tablet b) FLUoxetine (PROZAC) 20 MG capsule c) lamoTRIgine (LAMICTAL) 100 MG tablet  Routine PRN Medications:  None  Consultations: The patient was encouraged to keep all PCP and specialty clinic  appointments.   Safety Concerns:   Patient told to call clinic if any problems occur. Patient advised to go to  ER  if he should develop SI/HI, side effects, or if symptoms worsen. Has crisis numbers to call if needed.    Other: Patient meets the criteria for TMS Will send information for authorization of TMS    50% of this visit was spent in discussing TMS, the benefits, the procedure, the contraindications in length with patient and his wife at this visit Start time 12:30 PM Stop time 1:15 PM Nelly Rout, MD 02/21/2013

## 2013-02-22 ENCOUNTER — Encounter (HOSPITAL_COMMUNITY): Payer: Self-pay | Admitting: Psychiatry

## 2013-03-27 ENCOUNTER — Ambulatory Visit (INDEPENDENT_AMBULATORY_CARE_PROVIDER_SITE_OTHER): Payer: Medicare Other | Admitting: Psychiatry

## 2013-03-27 VITALS — BP 160/64 | HR 52 | Ht 73.5 in | Wt 246.8 lb

## 2013-03-27 DIAGNOSIS — F410 Panic disorder [episodic paroxysmal anxiety] without agoraphobia: Secondary | ICD-10-CM | POA: Diagnosis not present

## 2013-03-27 DIAGNOSIS — F332 Major depressive disorder, recurrent severe without psychotic features: Secondary | ICD-10-CM | POA: Insufficient documentation

## 2013-03-27 NOTE — Progress Notes (Signed)
Pt reported to [facility] for Transcranial Magnetic Stimulation treatment for Major Depressive Disorder. Pt completed a PHQ-9, rating a score of 12. Pt completed a Beck's Depression Inventory, rating a score of 23. This is the pt's initial [place] tx appointment. Pt received cortical mapping prior to tx. Tx parameters found during mapping are as follows: Treatment AP: 11.9 cm, SOA: 28 degrees, Coil Angle: 0 degrees, Recommended MT Level: 1.25 SMT. Pt tolerated tx well. Coil angle increased to +5 degrees due to pt discomfort. %MT titrated up to 100%. Will attempt to titrate up to 120% at the next tx session.

## 2013-03-28 ENCOUNTER — Other Ambulatory Visit (HOSPITAL_COMMUNITY): Payer: Medicare Other | Attending: Psychiatry | Admitting: *Deleted

## 2013-03-28 VITALS — BP 152/72 | HR 56

## 2013-03-28 DIAGNOSIS — F332 Major depressive disorder, recurrent severe without psychotic features: Secondary | ICD-10-CM

## 2013-03-28 NOTE — Progress Notes (Unsigned)
Pt reported to [facility] for Transcranial Magnetic Stimulation treatment for Major Depressive Disorder. Writer and pt discussed pt's depression inventory scores from previous day. Pt reported that the scores from the previous day may not have been accurate for three reasons: he has difficulty staying focused, he has difficulty being honest about how he is really feeling, and he was having a "good day" yesterday. Pt completed another PHQ-9 and Beck's Depression Inventory. Writer encouraged pt to take his time and stay focused, only answering a question after he has fully understood it. Writer also encouraged pt to be honest about how he is feeling and consider how he has been feeling over the past two weeks as opposed to in the present moment. Upon completing new inventories, pt rated a Beck's Depression Inventory score of 51 and a PHQ-9 score of 25. Pt tolerated tx well. Coil angle adjusted to +10 degrees for pt comfort. MT level titrated up to 110%. Writer informed pt that staff, including MD, will double check his MT Location and Dose before tx tomorrow 03/29/13.

## 2013-03-29 ENCOUNTER — Ambulatory Visit (INDEPENDENT_AMBULATORY_CARE_PROVIDER_SITE_OTHER): Payer: Medicare Other | Admitting: Psychiatry

## 2013-03-29 VITALS — BP 156/66 | HR 60

## 2013-03-29 DIAGNOSIS — F332 Major depressive disorder, recurrent severe without psychotic features: Secondary | ICD-10-CM | POA: Diagnosis not present

## 2013-03-29 NOTE — Progress Notes (Signed)
Pt reported to [facility] for Transcranial Magnetic Stimulation motor threshold relocation and treatment for Major Depressive Disorder. Pt's motor threshold and dose were recalculated. Tx parameters are as follows: Tx AP: 11.9 cm, SOA: 32 degrees, Coil Angle: +10 degrees, Dose: 1.23 SMT. Pt tolerated tx well. %MT titrated up to 120%. Will continue to treat at this level in future txs.

## 2013-04-01 ENCOUNTER — Other Ambulatory Visit (HOSPITAL_COMMUNITY): Payer: Medicare Other | Attending: Psychiatry | Admitting: *Deleted

## 2013-04-01 VITALS — BP 152/62 | HR 56

## 2013-04-01 DIAGNOSIS — F332 Major depressive disorder, recurrent severe without psychotic features: Secondary | ICD-10-CM

## 2013-04-01 NOTE — Progress Notes (Unsigned)
Pt reported to [facility] for Transcranial Magnetic Stimulation treatment for Major Depressive Disorder. Pt reported that he is experienced a very mild headache on the side of his head opposite the tx location over the weekend. Pt reported that it was not bad enough to necessitate taking medication. Pt's wife reported that the pt has been exhibiting a lot of positive thinking in regard to [place], saying that he hopes it will work for him. Pt tolerated tx well. Tx auto-paused 1X due to loss of coil contact. Coil contact regained and tx resumed without issue. %MT remained at 120% for the duration of tx.

## 2013-04-02 ENCOUNTER — Other Ambulatory Visit (HOSPITAL_COMMUNITY): Payer: Medicare Other | Attending: Psychiatry | Admitting: *Deleted

## 2013-04-02 VITALS — BP 142/70 | HR 56

## 2013-04-02 DIAGNOSIS — F332 Major depressive disorder, recurrent severe without psychotic features: Secondary | ICD-10-CM | POA: Diagnosis not present
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
## 2013-04-03 ENCOUNTER — Encounter (HOSPITAL_COMMUNITY): Payer: Self-pay | Admitting: Psychiatry

## 2013-04-03 ENCOUNTER — Other Ambulatory Visit (HOSPITAL_COMMUNITY): Payer: Medicare Other | Attending: Psychiatry | Admitting: *Deleted

## 2013-04-03 ENCOUNTER — Ambulatory Visit (INDEPENDENT_AMBULATORY_CARE_PROVIDER_SITE_OTHER): Payer: Medicare Other | Admitting: Psychiatry

## 2013-04-03 VITALS — BP 132/66 | HR 57 | Wt 247.0 lb

## 2013-04-03 VITALS — BP 134/62 | HR 56 | Ht 73.0 in | Wt 244.8 lb

## 2013-04-03 DIAGNOSIS — F329 Major depressive disorder, single episode, unspecified: Secondary | ICD-10-CM

## 2013-04-03 DIAGNOSIS — F332 Major depressive disorder, recurrent severe without psychotic features: Secondary | ICD-10-CM

## 2013-04-03 MED ORDER — TRAZODONE HCL 50 MG PO TABS: 50.0000 mg | ORAL_TABLET | Freq: Every day | ORAL | Status: DC

## 2013-04-03 NOTE — Progress Notes (Signed)
Pt reported to [facility] for Transcranial Magnetic Stimulation treatment for Major Depressive Disorder. Pt reported that the psychiatrist he sees for medication mgmt advised him to d/c his klonopin, which he takes nightly, and begin taking Vistaril. Pt reported that he plans to comply with MD advice. Pt completed a PHQ-9, rating a score of 19. Pt tolerated tx well. %MT remained at 120% for the duration of tx.

## 2013-04-03 NOTE — Progress Notes (Signed)
[facility] Follow-up Outpatient Visit  Austin Shaffer 1945/06/09 Austin Shaffer [id number] 68 y.o.  04/03/2013 11:01 AM  Chief Complaint: Follow up.  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 The patient is referred for psychiatric services for  medication management.    . Location: The patient reports he has started Transcranial Magnetic Stimulation.  He reports that he continues to have difficulty with sleep. . Quality:  In the area of affective symptoms, patient appears anxious. Patient denies current suicidal ideation, intent, or plan. Patient denies current homicidal ideation, intent, or plan. Patient denies auditory hallucinations. Patient denies visual hallucinations. Patient denies symptoms of paranoia. Patient states sleep is poor even with clonazepam.  Appetite is increased. Energy level is fair. Patient endorses symptoms of anhedonia for the past 15-20 years. Patient endorses some hopelessness, helplessness, and guilt.   . Severity: Depression: 2-3/10 (0=Very depressed; 5=Neutral; 10=Very Happy)  Anxiety- 5/10 (0=no anxiety; 5= moderate/tolerable anxiety; 10= panic attacks)  . Duration: Today he reports symptoms since 1989-90  . Timing: worse when meeting new people; with frustration lasts one hour  . Context: New people, death of brother.  . Modifying factors: Being home  . Associated signs and symptoms: As noted in psychiatric ROS.  Suicidal Ideation: Negative Plan Formed: Negative Patient has means to carry out plan: Negative  Homicidal Ideation: Negative Plan Formed: Negative Patient has means to carry out plan: Negative  Review of Systems: Psychiatric: Agitation: Yes Hallucination: Negative Depressed Mood: Yes Insomnia: No Hypersomnia: No Altered Concentration: No Feels  Worthless: No Grandiose Ideas: No Belief In Special Powers: No New/Increased Substance Abuse: No Compulsions: No  Neurologic: Headache: Negative Seizure: Negative Paresthesias: Negative  Past Medical Family, Social History:  Past Medical History  Diagnosis Date  . Cancer   . Hyperlipemia   . Anxiety and depression   . White coat hypertension   . Anxiety   . Depression    Family History  Problem Relation Age of Onset  . Cancer Mother   . Hypertension Father   . Hyperlipidemia Father   . Hypertension Brother    History   Social History  . Marital Status: Married    Spouse Name: N/A    Number of Children: N/A  . Years of Education: N/A   Occupational History  . Not on file.   Social History Main Topics  . Smoking status: Never Smoker   . Smokeless tobacco: Not on file  . Alcohol Use: No  . Drug Use: No  . Sexual Activity: Not on file   Other Topics Concern  . Not on file   Social History Narrative  . No narrative on file     Outpatient Encounter Prescriptions as of 04/03/2013  Medication Sig  . amLODipine (NORVASC) 2.5 MG tablet   . atenolol (TENORMIN) 25 MG tablet Take 25 mg by mouth daily.    Marland Kitchen atorvastatin (LIPITOR) 40 MG tablet Take 40 mg by mouth daily.    . clonazePAM (KLONOPIN) 1 MG tablet TAKE 1 TABLET BY MOUTH DAY  . FLUoxetine (PROZAC) 20 MG capsule Take 2 capsules (40 mg total) by mouth daily.  . fluticasone (FLONASE) 50 MCG/ACT nasal spray   . gabapentin (NEURONTIN) 300 MG capsule   . HYDROcodone-acetaminophen (NORCO) 10-325 MG per tablet   . lamoTRIgine (LAMICTAL) 100 MG tablet Take 1 tablet (100 mg  total) by mouth daily.  Marland Kitchen losartan-hydrochlorothiazide (HYZAAR) 100-12.5 MG per tablet Take 1 tablet by mouth daily.      Past Psychiatric History/Hospitalization(s): Anxiety: Negative Bipolar Disorder: Negative Depression: Negative Mania: Negative Psychosis: Negative Schizophrenia: Negative Personality Disorder: Negative Hospitalization  for psychiatric illness: No History of Electroconvulsive Shock Therapy: Yes Prior Suicide Attempts: Negative  Review of Systems  Constitutional: Negative for fever, chills, weight loss and malaise/fatigue.  Eyes: Negative for blurred vision and double vision.  Respiratory: Negative for cough, hemoptysis, sputum production and shortness of breath.   Cardiovascular: Negative for chest pain, palpitations and leg swelling.  Gastrointestinal: Positive for heartburn. Negative for nausea, vomiting, abdominal pain, diarrhea and constipation.  Genitourinary: Negative for dysuria, urgency and frequency.  Skin: Negative for itching and rash.  Neurological: Negative for dizziness, tingling, seizures and loss of consciousness.    Physical Exam: Constitutional: Filed Vitals:   04/03/13 1114  BP: 132/66  Pulse: 57  Weight: 247 lb (112.038 kg)   General Appearance: alert, oriented, no acute distress and well nourished Musculoskeletal: Strength & Muscle Tone: within normal limits Gait & Station: normal Patient leans: Right  Psychiatric Specialty Examination: General Appearance: Negative  Eye Contact::  Good  Speech:  Clear and Coherent and Normal Rate  Volume:  Normal  Mood:  "pretty good, a little anxiety"  Affect:  Appropriate, Congruent and Full Range  Thought Process:  Coherent, Linear and Logical  Orientation:  Full (Time, Place, and Person)  Thought Content:  WDL  Suicidal Thoughts:  No  Homicidal Thoughts:  No  Memory:  Immediate;   Good Recent;   Fair Remote;   Good  Judgement:  Good  Insight:  Fair  Psychomotor Activity:  Normal  Concentration:  Poor  Recall:  Fair  Akathisia:  Yes  Handed:  Right  AIMS (if indicated):     Assets:  Communication Skills Desire for Improvement Financial Resources/Insurance     Assessment: Major depressive disorder, recurrent, severe, without psychotic features-stable Panic disorder-Stable  Axis I: Major depressive disorder,  recurrent, severe, Panic disorder   Plan:   Plan of Care:  PLAN:  1. Affirm with the patient that the medications are taken as ordered. Patient  expressed understanding of how their medications were to be used.    Laboratory:  No labs warranted at this time.    Psychotherapy: Therapy: brief supportive therapy provided.  Discussed psychosocial stressors in detail.    Medications:  Continue the following psychiatric medications as written prior to this appointment with the following changes::  a) Decrease clonazePAM (KLONOPIN) 0.5 MG tablet-as needed for anxiety- b) FLUoxetine (PROZAC) 40 MG capsule c) lamoTRIgine (LAMICTAL) 100 MG tablet D) Start trazodone 50 mg for insomnia -Risks and benefits, side effects and alternatives discussed with patient, he was given an opportunity to ask questions about her medication, illness, and treatment. All current psychiatric medications have been reviewed and discussed with the patient and adjusted as clinically appropriate. The patient has been provided an accurate and updated list of the medications being now prescribed.   Routine PRN Medications:  Negative  Consultations: The patient was encouraged to keep all PCP and specialty clinic appointments.   Safety Concerns:   Patient told to call clinic if any problems occur. Patient advised to go to  ER  if he should develop SI/HI, side effects, or if symptoms worsen. Has crisis numbers to call if needed.    Other:   8. Patient was instructed to return to clinic in  2 months.  9. The patient was advised to call and cancel their mental health appointment within 24 hours of the appointment, if they are unable to keep the appointment, as well as the three no show and termination from clinic policy. 10. The patient expressed understanding of the plan and agrees with the above.  Time Spent: 30 minutes  Coralyn Helling, MD 04/03/2013

## 2013-04-04 ENCOUNTER — Other Ambulatory Visit (HOSPITAL_COMMUNITY): Payer: Medicare Other | Attending: Psychiatry | Admitting: *Deleted

## 2013-04-04 VITALS — BP 146/72 | HR 56

## 2013-04-04 DIAGNOSIS — F329 Major depressive disorder, single episode, unspecified: Secondary | ICD-10-CM

## 2013-04-04 DIAGNOSIS — F332 Major depressive disorder, recurrent severe without psychotic features: Secondary | ICD-10-CM | POA: Diagnosis not present

## 2013-04-04 NOTE — Progress Notes (Signed)
Pt reported to [facility] for Transcranial Magnetic Stimulation treatment for Major Depressive Disorder. Pt reported that he did not take his klonopin the previous night before bed, taking Trazodpone instead, per the advice of his psychiatrist. Pt reported that he did not sleep well, sleeping a maximum of 6 hours. Pt reported feeling groggy as a result. Pt tolerated tx well. %MT remained at 120% for the duration of tx.

## 2013-04-05 ENCOUNTER — Ambulatory Visit (INDEPENDENT_AMBULATORY_CARE_PROVIDER_SITE_OTHER): Payer: Medicare Other | Admitting: *Deleted

## 2013-04-05 ENCOUNTER — Other Ambulatory Visit (HOSPITAL_COMMUNITY): Payer: Medicare Other | Attending: Psychiatry | Admitting: *Deleted

## 2013-04-05 VITALS — BP 146/74 | HR 56

## 2013-04-05 DIAGNOSIS — F332 Major depressive disorder, recurrent severe without psychotic features: Secondary | ICD-10-CM | POA: Diagnosis not present

## 2013-04-05 NOTE — Progress Notes (Signed)
Patient ID: [name], male   DOB: 10-Aug-1945, 68 y.o.   MRN: [id number] Pt reported to [facility] for Transcranial Magnetic Stimulation treatment for Major Depressive Disorder. Pt reported feeling groggy, although he reports sleeping well the previous night. Pt associates his grogginess with his recent discontinuation of Klonopin, which he has taken for approximately 15 years, as well as the recent start on Trazodone. Pt tolerated tx well. %MT remained at 120% for the duration of tx.

## 2013-04-08 ENCOUNTER — Other Ambulatory Visit (HOSPITAL_COMMUNITY): Payer: Medicare Other | Attending: Psychiatry | Admitting: *Deleted

## 2013-04-08 VITALS — BP 144/68 | HR 60

## 2013-04-08 DIAGNOSIS — F332 Major depressive disorder, recurrent severe without psychotic features: Secondary | ICD-10-CM

## 2013-04-08 NOTE — Progress Notes (Signed)
Patient ID: Austin Shaffer, male   DOB: [birth year], 68 y.o.   MRN: [id number] Pt reported to [facility] for Rapid Transcranial Magnetic Stimulation treatment for Major Depressive Disorder. Pt reported that he experienced elevated levels of anxiety over the weekend, as well as grogginess in the morning upon waking up. Pt attributed all these symptoms to the Trazodone prescribed to him by his psychiatrist. Pt reported that he did not take the Trazodone last night. Pt also reported that he drank 2 cups of coffee this morning as opposed to his usual 1 cup per morning. Pt tolerated tx well. Tx auto-paused 1X due to loss of coil contact. Coil contact regained and tx resumed without issue. %MT remained at 120% for the duration of tx.

## 2013-04-09 ENCOUNTER — Other Ambulatory Visit (HOSPITAL_COMMUNITY): Payer: Medicare Other | Attending: Psychiatry | Admitting: *Deleted

## 2013-04-09 VITALS — BP 144/64 | HR 56

## 2013-04-09 DIAGNOSIS — F332 Major depressive disorder, recurrent severe without psychotic features: Secondary | ICD-10-CM | POA: Diagnosis not present

## 2013-04-09 NOTE — Progress Notes (Signed)
Patient ID: Austin Shaffer, male   DOB: 1945/05/08, 68 y.o.   MRN: [id number] Pt reported to [facility] for Rapid Transcranial Magnetic Stimulation treatment for Major Depressive Disorder. Pt tolerated tx well. Pt reported muscular discomfort on the left side of his face during tx, so coil angle adjusted to +5 degrees. After this adjustment, pt verbalized discomfort down the middle of his face during pulses. Coil angle adjusted to +15 degrees. Pt reported that he experienced no discomfort after this adjustment. Tx auto-paused 1X due to loss of coil contact. Coil contact regained and tx resumed without issue. %MT remained at 120% for the duration of tx.

## 2013-04-10 ENCOUNTER — Other Ambulatory Visit (HOSPITAL_COMMUNITY): Payer: Medicare Other | Attending: Psychiatry | Admitting: *Deleted

## 2013-04-10 VITALS — BP 160/70 | HR 56 | Ht 73.0 in | Wt 246.6 lb

## 2013-04-10 DIAGNOSIS — F332 Major depressive disorder, recurrent severe without psychotic features: Secondary | ICD-10-CM

## 2013-04-10 NOTE — Progress Notes (Signed)
Patient ID: Austin Shaffer, male   DOB: September 06, 1945, 68 y.o.   MRN: [id number] Pt reported to [facility] for Rapid Transcranial Magnetic Stimulation treatment for Major Depressive Disorder. Pt completed a PHQ-9, rating a score of 23. Pt reported that he is still taking nothing for sleep, as he found the side effects from the Trazodone to be intolerable. Pt reported that the quality of his sleep is inconsistent, with some nights being better than others. Pt tolerated tx well. %MT remained at 120% for the duration of tx.

## 2013-04-11 ENCOUNTER — Other Ambulatory Visit (HOSPITAL_COMMUNITY): Payer: Medicare Other | Attending: Psychiatry | Admitting: *Deleted

## 2013-04-11 VITALS — BP 166/72 | HR 56

## 2013-04-11 DIAGNOSIS — F332 Major depressive disorder, recurrent severe without psychotic features: Secondary | ICD-10-CM

## 2013-04-11 NOTE — Progress Notes (Signed)
Patient ID: Austin Shaffer, male   DOB: 1945-04-22, 68 y.o.   MRN: [id number]  Pt reported to [facility] for Rapid Transcranial Magnetic Stimulation treatment for Major Depressive Disorder. Pt continues to have difficulty sleeping through the night. Pt verbalized that he has been waking up around 4:00 am and not able to fall back to sleep. Pt has been going to sleep around 10:30pm. Pt tolerated tx well. %MT remained at 120% for the duration of tx. Pt reported that he felt tense in the chair today "for some reason." Pt attributed this to his body's position in the chair.

## 2013-04-12 ENCOUNTER — Other Ambulatory Visit (HOSPITAL_COMMUNITY): Payer: Medicare Other | Attending: Psychiatry | Admitting: *Deleted

## 2013-04-12 ENCOUNTER — Telehealth (HOSPITAL_COMMUNITY): Payer: Self-pay

## 2013-04-12 VITALS — BP 144/66 | HR 56

## 2013-04-12 DIAGNOSIS — F332 Major depressive disorder, recurrent severe without psychotic features: Secondary | ICD-10-CM | POA: Diagnosis not present

## 2013-04-12 NOTE — Progress Notes (Signed)
Patient ID: Austin Shaffer, male   DOB: [birth year], 68 y.o.   MRN: [id number] Pt reported to [facility] for Rapid Transcranial Magnetic Stimulation treatment for Major Depressive Disorder. Pt reported that he took his Trazodone last night in hopes of sleeping better. Pt reported that he feels groggy this morning as a result. Pt tolerated tx well. %MT remained at 120% for the duration of tx.

## 2013-04-12 NOTE — Telephone Encounter (Signed)
Patient reports that he is having too much dry mouth with trazdone. The patient is still not sleeping. Advised trying benadryl or melatonin 3mg  to 10 mg for insomnia.

## 2013-04-15 ENCOUNTER — Other Ambulatory Visit (HOSPITAL_COMMUNITY): Payer: Medicare Other | Attending: Psychiatry | Admitting: *Deleted

## 2013-04-15 VITALS — BP 160/78 | HR 68

## 2013-04-15 DIAGNOSIS — F332 Major depressive disorder, recurrent severe without psychotic features: Secondary | ICD-10-CM | POA: Diagnosis not present

## 2013-04-15 NOTE — Progress Notes (Signed)
Patient ID: Austin Shaffer, male   DOB: 09-Dec-1945, 68 y.o.   MRN: [id number] Pt reported to [facility] for Rapid Transcranial Magnetic Stimulation treatment for Major Depressive Disorder. Pt reported that he continues to have difficulty sleeping through the night. Pt wakes up in the night and is unable to go back to sleep. Pt's psychiatrist advised pt to use OTC sleep aids to help with this. Pt began taking Zzzquil two nights ago, but has not experienced improved sleep as a result. Pt also began taking his Prozac in the evening rather than mid-day in hopes of improving his sleep, with no results. Pt tolerated tx well. %MT remained at 120% for the duration of tx.

## 2013-04-16 ENCOUNTER — Other Ambulatory Visit (HOSPITAL_COMMUNITY): Payer: Medicare Other | Attending: Psychiatry | Admitting: *Deleted

## 2013-04-16 VITALS — BP 148/75 | HR 55

## 2013-04-16 DIAGNOSIS — F332 Major depressive disorder, recurrent severe without psychotic features: Secondary | ICD-10-CM

## 2013-04-16 NOTE — Progress Notes (Signed)
Patient ID: Austin Shaffer, male   DOB: 12-16-1945, 68 y.o.   MRN: [id number] Pt reported to [facility] for Rapid Transcranial Magnetic Stimulation treatment for Major Depressive Disorder. Pt reported feeling anxious this morning for no apparent reason. Pt tolerated tx well. %MT remained at 120% for the duration of tx.

## 2013-04-17 ENCOUNTER — Other Ambulatory Visit (HOSPITAL_COMMUNITY): Payer: Medicare Other | Attending: Psychiatry | Admitting: *Deleted

## 2013-04-17 VITALS — BP 174/72 | HR 56 | Ht 73.0 in | Wt 245.0 lb

## 2013-04-17 DIAGNOSIS — F332 Major depressive disorder, recurrent severe without psychotic features: Secondary | ICD-10-CM | POA: Diagnosis not present

## 2013-04-17 NOTE — Progress Notes (Signed)
Patient ID: Austin Shaffer, male   DOB: July 04, 1945, 68 y.o.   MRN: [id number] Pt reported to [facility] for Rapid Transcranial Magnetic Stimulation treatment for Major Depressive Disorder. Pt reported that he slept well last night. Pt took no medication before bedtime to help him sleep. Pt also reported that he experienced relief from his depressive symptoms for approximately 4 hours yesterday, including decreased rumination, decreased anxiety, increased energy level, and increased motivation. Pt completed a PHQ-9, rating a score of 23. Pt verbalized that he completed the questionnaire "the best that I could." Pt also verbalized that he often experiences difficulty with focus and reading comprehension. Pt tolerated tx well. %MT remained at 120% for the duration of tx.

## 2013-04-18 ENCOUNTER — Other Ambulatory Visit (HOSPITAL_COMMUNITY): Payer: Medicare Other | Attending: Psychiatry | Admitting: *Deleted

## 2013-04-18 VITALS — BP 154/82 | HR 60

## 2013-04-18 DIAGNOSIS — F332 Major depressive disorder, recurrent severe without psychotic features: Secondary | ICD-10-CM

## 2013-04-18 NOTE — Progress Notes (Signed)
Patient ID: Austin Shaffer, male   DOB: Mar 24, 1945, 68 y.o.   MRN: [id number] Pt reported to [facility] for Rapid Transcranial Magnetic Stimulation treatment for Major Depressive Disorder. Pt reported that he did not sleep well the previous night, and that he did not take any sleep aid medication before bed time. Pt tolerated tx well. %MT remained at 120% for the duration of tx.

## 2013-04-19 ENCOUNTER — Other Ambulatory Visit (HOSPITAL_COMMUNITY): Payer: Medicare Other | Attending: Psychiatry | Admitting: *Deleted

## 2013-04-19 DIAGNOSIS — F332 Major depressive disorder, recurrent severe without psychotic features: Secondary | ICD-10-CM

## 2013-04-19 NOTE — Progress Notes (Signed)
Patient ID: Austin Shaffer, male   DOB: 1945/12/17, 68 y.o.   MRN: [id number] Pt reported to [facility] for Rapid Transcranial Magnetic Stimulation treatment for Major Depressive Disorder. Pt reported that he slept well last night, sleeping 6-7 hours. Pt continues to experience anxiety regarding his elevated blood pressure due to whitecoat hypertension. Pt tolerated tx well. %MT remained at 120% for the duration of tx.

## 2013-04-22 ENCOUNTER — Other Ambulatory Visit (HOSPITAL_COMMUNITY): Payer: Self-pay

## 2013-04-22 ENCOUNTER — Other Ambulatory Visit (HOSPITAL_COMMUNITY): Payer: Medicare Other | Attending: Psychiatry | Admitting: *Deleted

## 2013-04-22 VITALS — BP 166/70 | HR 60

## 2013-04-22 DIAGNOSIS — F332 Major depressive disorder, recurrent severe without psychotic features: Secondary | ICD-10-CM | POA: Diagnosis not present

## 2013-04-22 NOTE — Progress Notes (Signed)
Patient ID: Austin Shaffer, male   DOB: 08/15/1945, 68 y.o.   MRN: [id number] Pt reported to [facility] for Rapid Transcranial Magnetic Stimulation treatment for Major Depressive Disorder. Pt tolerated tx well. %MT remained at 120% for the duration of tx.

## 2013-04-23 ENCOUNTER — Other Ambulatory Visit (HOSPITAL_COMMUNITY): Payer: Medicare Other | Attending: Psychiatry | Admitting: *Deleted

## 2013-04-23 ENCOUNTER — Telehealth (HOSPITAL_COMMUNITY): Payer: Self-pay | Admitting: Psychiatry

## 2013-04-23 VITALS — BP 172/76 | HR 60

## 2013-04-23 DIAGNOSIS — F332 Major depressive disorder, recurrent severe without psychotic features: Secondary | ICD-10-CM

## 2013-04-23 NOTE — Telephone Encounter (Signed)
Called patient. Asked him to call the clinic regarding options for insomnia. Will discuss risks, benefits and side effects.

## 2013-04-23 NOTE — Progress Notes (Signed)
Patient ID: Austin Shaffer, male   DOB: [birth year], 68 y.o.   MRN: [id number] Pt reported to [facility] for Rapid Transcranial Magnetic Stimulation treatment for Major Depressive Disorder. Pt reported that he slept at least 6 hours without interruption last night. Pt reported that he took OTC Zzzquil to help him sleep. Pt reported that he intends to take Zzzquil, Melatonin, or Trazodone tonight to help him sleep. Pt tolerated tx well. %MT remained at 120% for the duration of tx.

## 2013-04-24 ENCOUNTER — Other Ambulatory Visit (INDEPENDENT_AMBULATORY_CARE_PROVIDER_SITE_OTHER): Payer: Medicare Other | Admitting: *Deleted

## 2013-04-24 VITALS — BP 142/72 | HR 60 | Ht 73.0 in | Wt 242.8 lb

## 2013-04-24 DIAGNOSIS — F332 Major depressive disorder, recurrent severe without psychotic features: Secondary | ICD-10-CM | POA: Diagnosis not present

## 2013-04-24 IMAGING — CR DG KNEE 1-2V*L*
2 series · 2 of 2 positions shown · non-contrast
Comparison: None.

CLINICAL DATA: Knee pain since an injury December 2010.

LEFT KNEE - 1-2 VIEW

[view not recorded (1 of 2)]
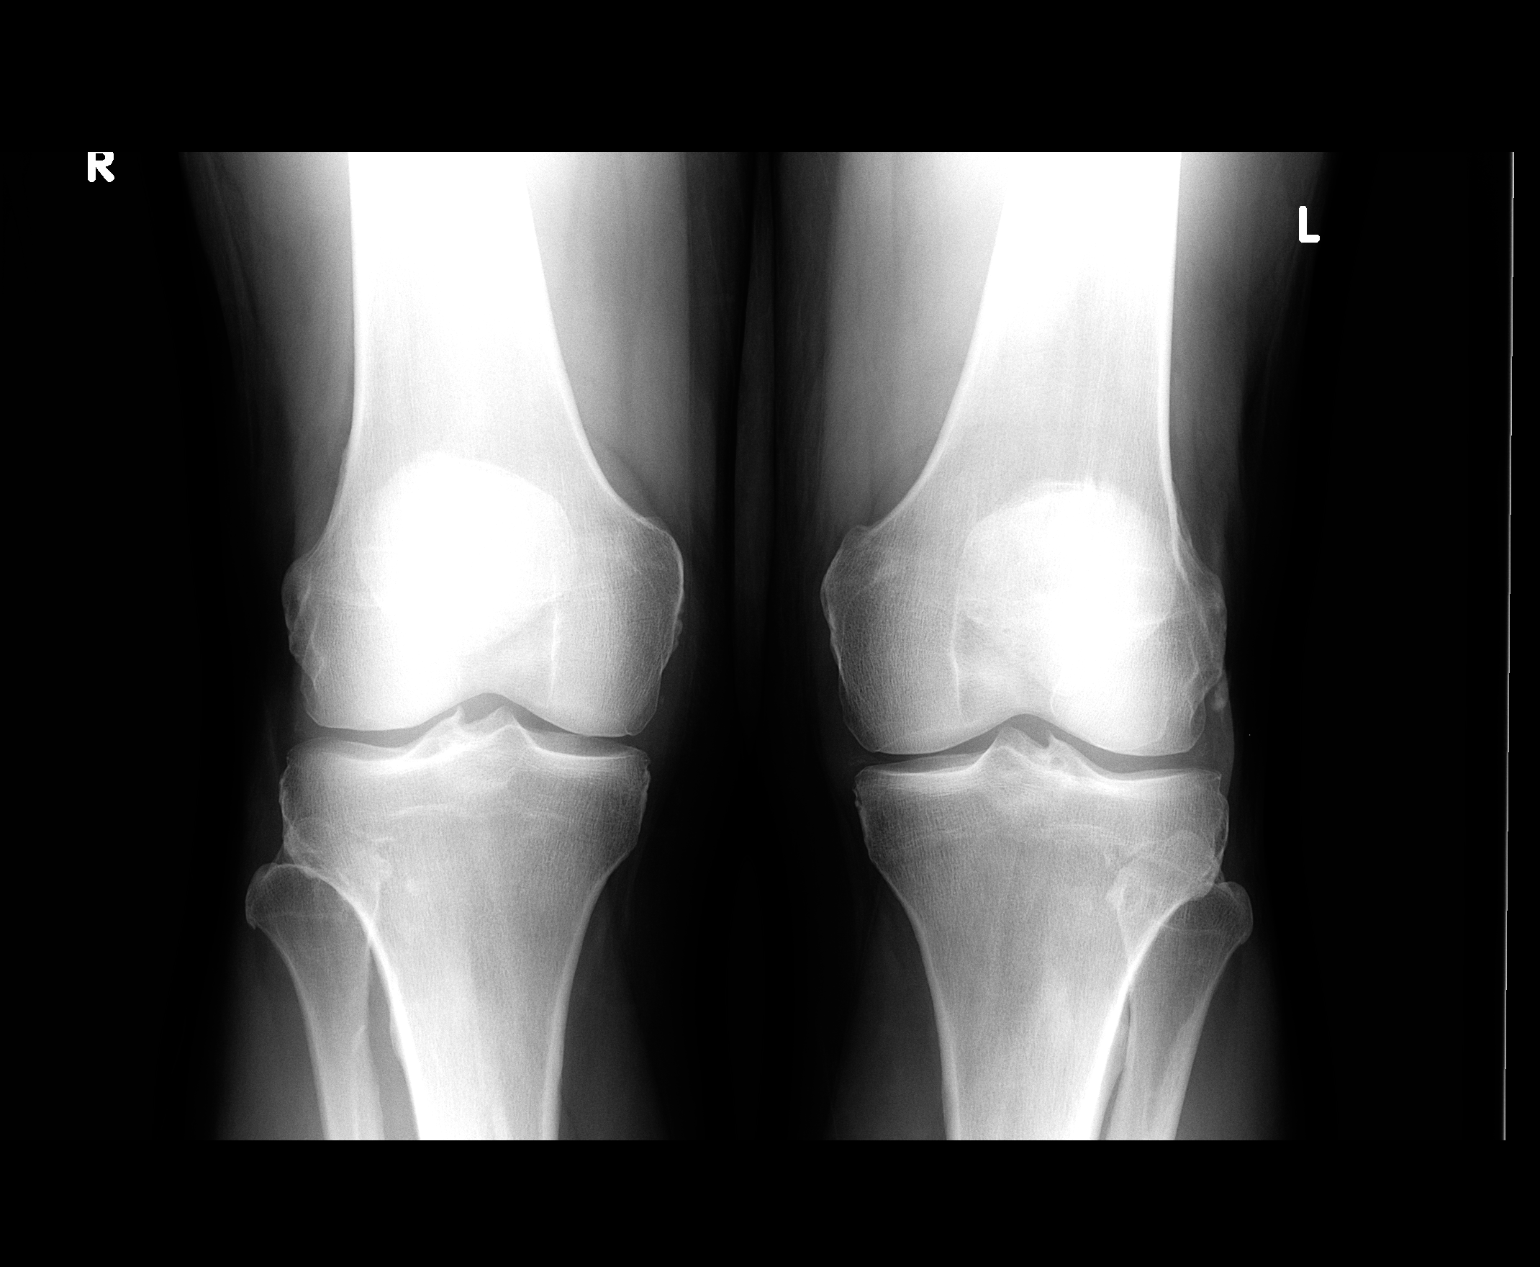

[view not recorded (2 of 2)]
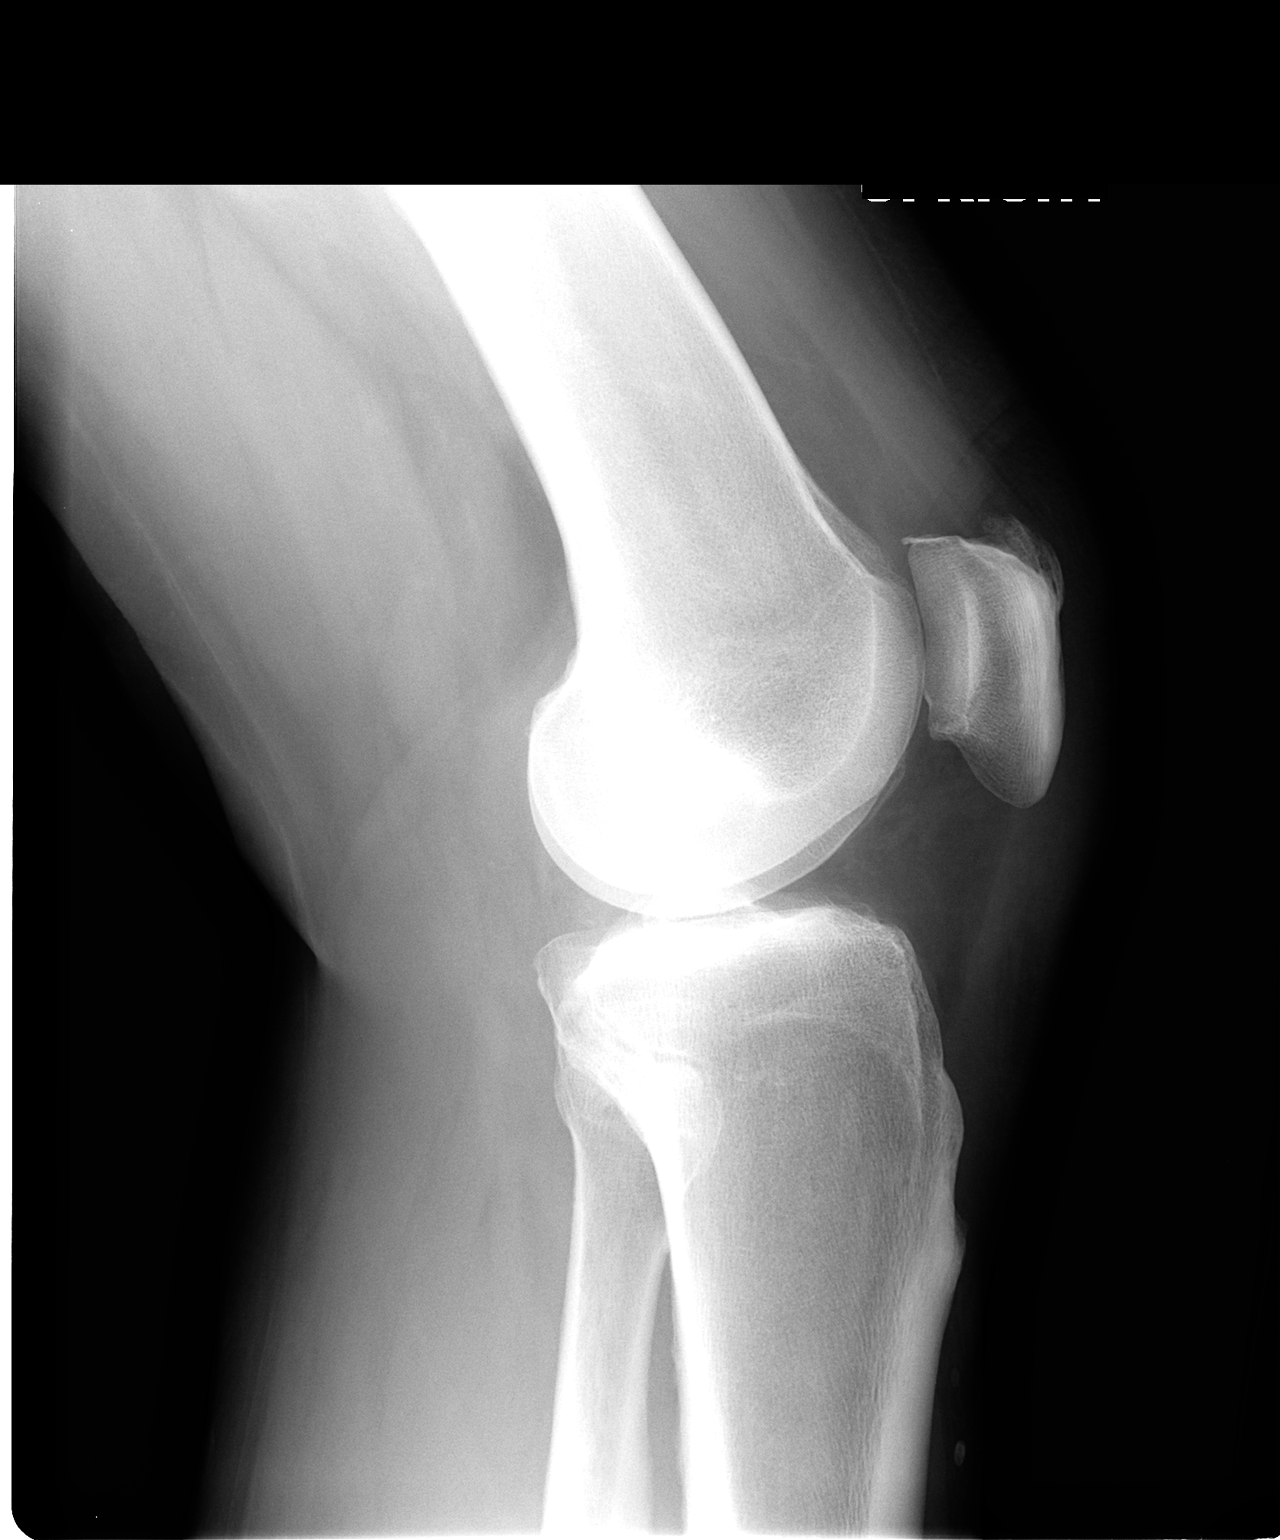

[2 of 2 positions shown; findings below may reference images not displayed]

FINDINGS: No acute bony or joint abnormality is identified.  Mild
appearing degenerative change present about both knees.  No joint
effusion on the left is identified.  No lateral view of the right
knee is provided. Small ossification off the lateral femoral
condyle on the left is likely due to old trauma.
IMPRESSION: No acute finding.  Mild appearing degenerative change.

## 2013-04-24 NOTE — Progress Notes (Signed)
Patient ID: Austin Shaffer, male   DOB: Nov 16, 1945, 68 y.o.   MRN: [id number] Pt reported to [facility] for rapid Transcranial Magnetic Stimulation treatment for Major Depressive Disorder. Pt reports not sleeping well last night. Pt only got four hours of sleep even with the ZZZquil. Pt's wife reports that pt had a bad afternoon and did not feel well, but pt did get outside to walk. Pt reports that he did not start his Seroquel due to some apprehensions, but pt plans on using it today. Pt scored a 20 on the weekly PHQ-9 questionnaire. Pt tolerated tx well. %MT remained at 120 for the duration of the tx. Tx paused twice to put more pressure on the coil per patient request.

## 2013-04-25 ENCOUNTER — Telehealth (HOSPITAL_COMMUNITY): Payer: Self-pay | Admitting: Psychiatry

## 2013-04-25 ENCOUNTER — Other Ambulatory Visit (INDEPENDENT_AMBULATORY_CARE_PROVIDER_SITE_OTHER): Payer: Medicare Other | Admitting: *Deleted

## 2013-04-25 VITALS — BP 131/73 | HR 61

## 2013-04-25 DIAGNOSIS — F332 Major depressive disorder, recurrent severe without psychotic features: Secondary | ICD-10-CM

## 2013-04-25 MED ORDER — QUETIAPINE FUMARATE 50 MG PO TABS: ORAL_TABLET | ORAL | Status: DC

## 2013-04-25 NOTE — Progress Notes (Signed)
Patient ID: Austin Shaffer, male   DOB: October 04, 1945, 68 y.o.   MRN: [id number] Pt reported to [facility] for rapid Transcranial Magnetic Stimulation treatment for Major Depressive Disorder. Pt reported that he is continuing to not sleep well at night. Pt is using Zzzquil and melatonin to sleep through the night. Pt reports that he is having a better day today than he had yesterday. Pt tolerated tx well. %MT remained at 120 for the duration of the tx. Tx was paused twice for loss of coil contact which was regained with no issue. Pt voiced no concerns.

## 2013-04-25 NOTE — Telephone Encounter (Signed)
Started seroquel for mood and insomnia.

## 2013-04-26 ENCOUNTER — Other Ambulatory Visit (INDEPENDENT_AMBULATORY_CARE_PROVIDER_SITE_OTHER): Payer: Medicare Other | Admitting: *Deleted

## 2013-04-26 VITALS — BP 126/72 | HR 56

## 2013-04-26 DIAGNOSIS — F332 Major depressive disorder, recurrent severe without psychotic features: Secondary | ICD-10-CM

## 2013-04-26 NOTE — Progress Notes (Signed)
Patient ID: Austin Shaffer, male   DOB: [birth year], 68 y.o.   MRN: [id number] Pt reported to [facility] for Rapid Transcranial Magnetic Stimulation treatment for Major Depressive Disorder. Pt reported that he slept approximately 6 hours last night, from 10pm-4am. Pt's wife reported that she obtained pt's Seroquel yesterday, but pt decided not to take it last night before bed. Pt plans to try the Seroquel for the first time tonight before bed. Pt tolerated tx well. %MT remained at 120% for the duration of tx. Staff and pt discussed the benefit the pt has seen from [place] treatment. Pt reported that he often experiences relief from his depressive symptoms for several hours after tx, but his depression eventually returns. Pt shared that he believes the lack of quality in his sleep contributes to this issue.

## 2013-04-29 ENCOUNTER — Other Ambulatory Visit (INDEPENDENT_AMBULATORY_CARE_PROVIDER_SITE_OTHER): Payer: Medicare Other | Admitting: *Deleted

## 2013-04-29 VITALS — BP 150/70 | HR 60

## 2013-04-29 DIAGNOSIS — F332 Major depressive disorder, recurrent severe without psychotic features: Secondary | ICD-10-CM | POA: Diagnosis not present

## 2013-04-29 NOTE — Progress Notes (Signed)
Patient ID: Austin Shaffer, male   DOB: 1945-09-05, 68 y.o.   MRN: [id number] Pt reported to [facility] for Repetitive Transcranial Magnetic Stimulation treatment for Major Depressive Disorder. Pt reported that he took Trazodone for sleep on Friday (2/13) night -- slept well, but experienced intolerable dry mouth on Saturday (2/14). Pt took Seroquel 25 mg for sleep on Saturday (2/14) night -- slept well, but was groggy all day Sunday (2/15). Pt took Seroquel 25 mg again Sunday (2/15) night -- did not sleep well, waking up around 4 am unable to fall back asleep, but is not experiencing drowsiness today. Pt plans to take Seroquel 50 mg tonight for sleep. Pt tolerated tx well. %MT remained at 120% for the duration of tx.

## 2013-04-30 ENCOUNTER — Other Ambulatory Visit (INDEPENDENT_AMBULATORY_CARE_PROVIDER_SITE_OTHER): Payer: Medicare Other | Admitting: *Deleted

## 2013-04-30 DIAGNOSIS — F332 Major depressive disorder, recurrent severe without psychotic features: Secondary | ICD-10-CM

## 2013-05-01 ENCOUNTER — Other Ambulatory Visit (INDEPENDENT_AMBULATORY_CARE_PROVIDER_SITE_OTHER): Payer: Medicare Other | Admitting: *Deleted

## 2013-05-01 VITALS — BP 124/70 | HR 60 | Ht 73.0 in | Wt 244.0 lb

## 2013-05-01 DIAGNOSIS — F332 Major depressive disorder, recurrent severe without psychotic features: Secondary | ICD-10-CM

## 2013-05-01 NOTE — Progress Notes (Signed)
Patient ID: Austin Shaffer, male   DOB: 1945/03/18, 68 y.o.   MRN: [id number] Pt reported to [facility] for Rapid Transcranial Magnetic Stimulation treatment for Major Depressive Disorder. Pt reported that he did not sleep well last night (woke up unable to fall back asleep), although he did sleep well night before last. Pt scored a 21 on the weekly PHQ-9. Pt tolerated tx well. %MT remained at 120% for the duration of tx.

## 2013-05-01 NOTE — Progress Notes (Signed)
Patient ID: Austin Shaffer, male   DOB: 10-04-1945, 68 y.o.   MRN: [id number] Pt reported to [facility] for Rapid Transcranial Magnetic Stimulation treatment for Major Depressive Disorder. Pt reported that he did not sleep well last night (woke up unable to fall back asleep), although he did sleep well night before last. Pt scored a on the weekly PHQ-9. Pt tolerated tx well. %MT remained at 120% for the duration of tx.

## 2013-05-02 ENCOUNTER — Other Ambulatory Visit (INDEPENDENT_AMBULATORY_CARE_PROVIDER_SITE_OTHER): Payer: Medicare Other | Admitting: *Deleted

## 2013-05-02 VITALS — BP 138/78 | HR 60

## 2013-05-02 DIAGNOSIS — F332 Major depressive disorder, recurrent severe without psychotic features: Secondary | ICD-10-CM

## 2013-05-02 NOTE — Progress Notes (Signed)
Patient ID: Austin Shaffer, male   DOB: [birth year], 68 y.o.   MRN: [id number] Pt reported to [facility] for RepetitiveTranscranial Magnetic Stimulation treatment for Major Depressive Disorder. Pt reported that he took Seroquel 50 mg last night for sleep. Pt reported that he woke up several times, but otherwise slept well. Pt reported that he felt groggy after waking up, but this subsided around 8:00am. Pt reported that he experienced a headache approximately 2 hours after receiving treatment, which lasted until approximately 9:00pm yesterday evening. Writer informed pt that mild headache is a common side effect of TMS therapy. Pt tolerated tx well. %MT remained at 120% for the duration of tx.

## 2013-05-03 ENCOUNTER — Other Ambulatory Visit (INDEPENDENT_AMBULATORY_CARE_PROVIDER_SITE_OTHER): Payer: Medicare Other | Admitting: *Deleted

## 2013-05-03 VITALS — BP 150/64 | HR 56

## 2013-05-03 DIAGNOSIS — F332 Major depressive disorder, recurrent severe without psychotic features: Secondary | ICD-10-CM

## 2013-05-03 NOTE — Progress Notes (Signed)
Patient ID: Austin Shaffer, male   DOB: 14-Dec-1945, 68 y.o.   MRN: [id number] Pt reported to [facility] for Repetitive Transcranial Magnetic Stimulation treatment for Major Depressive Disorder. Pt reported that he felt less groggy this morning. Pt took 50 mg of Seroquel for the second consecutive night last night before bed. Pt tolerated tx well. %MT remained at 120% for the duration of tx.

## 2013-05-06 ENCOUNTER — Other Ambulatory Visit (INDEPENDENT_AMBULATORY_CARE_PROVIDER_SITE_OTHER): Payer: Medicare Other | Admitting: *Deleted

## 2013-05-06 VITALS — BP 132/62 | HR 56

## 2013-05-06 DIAGNOSIS — F332 Major depressive disorder, recurrent severe without psychotic features: Secondary | ICD-10-CM | POA: Diagnosis not present

## 2013-05-06 NOTE — Progress Notes (Signed)
Patient ID: Austin Shaffer, male   DOB: October 02, 1945, 68 y.o.   MRN: [id number] Pt reported to [facility] for Repetitive Transcranial Magnetic Stimulation treatment for Major Depressive Disorder. Pt reports that he feels groggy this morning. Pt tolerated tx well. %MT remained at 120% for the duration of tx.

## 2013-05-07 ENCOUNTER — Other Ambulatory Visit (INDEPENDENT_AMBULATORY_CARE_PROVIDER_SITE_OTHER): Payer: Medicare Other | Admitting: *Deleted

## 2013-05-07 VITALS — BP 144/66 | HR 60

## 2013-05-07 DIAGNOSIS — F332 Major depressive disorder, recurrent severe without psychotic features: Secondary | ICD-10-CM | POA: Diagnosis not present

## 2013-05-07 NOTE — Progress Notes (Signed)
Patient ID: Austin Shaffer, male   DOB: 1945/10/17, 68 y.o.   MRN: [id number] Pt reported to [facility] for Repetitive Transcranial Magnetic Stimulation treatment for Major Depressive Disorder. Pt reported that he slept well last night, but he continues to experience drowsiness in the morning. Pt reported that his drowsiness typically abates around 11:00am. Pt tolerated tx well. %MT remained at 120% for the duration of tx.

## 2013-05-08 ENCOUNTER — Other Ambulatory Visit (INDEPENDENT_AMBULATORY_CARE_PROVIDER_SITE_OTHER): Payer: Medicare Other | Admitting: *Deleted

## 2013-05-08 VITALS — BP 130/54 | HR 64 | Ht 73.0 in | Wt 243.6 lb

## 2013-05-08 DIAGNOSIS — F332 Major depressive disorder, recurrent severe without psychotic features: Secondary | ICD-10-CM

## 2013-05-08 NOTE — Progress Notes (Addendum)
Patient ID: Austin Shaffer, male   DOB: 10/15/1945, 68 y.o.   MRN: [id number] Pt reported to [facility] for Repetitive Transcranial Magnetic Stimulation treatment for Major Depressive Disorder. Pt endorsed grogginess this morning. Pt reported that he drank 3 cups of coffee this morning, whereas he normally drinks 1.5. Pt reported that "yesterday was the best day I have had in a long time." Pt had difficulty explaining what that statement meant, but eventually verbalized that he felt "bright, not dark" and "could think of things I wanted to do." Pt reported that he has felt less depressed over the last 2 weeks. Pt scored a 14 on the weekly PHQ-9. Pt tolerated tx well. %MT remained at 120% for the duration of tx. Pt also completed a Beck's Depression Inventory, rating a score of 22.

## 2013-05-08 NOTE — Progress Notes (Signed)
This encounter was created in error - please disregard.

## 2013-05-09 ENCOUNTER — Ambulatory Visit (HOSPITAL_COMMUNITY): Payer: Self-pay | Admitting: Psychiatry

## 2013-05-10 ENCOUNTER — Other Ambulatory Visit (INDEPENDENT_AMBULATORY_CARE_PROVIDER_SITE_OTHER): Payer: Medicare Other | Admitting: *Deleted

## 2013-05-10 VITALS — BP 150/70 | HR 56

## 2013-05-10 DIAGNOSIS — F332 Major depressive disorder, recurrent severe without psychotic features: Secondary | ICD-10-CM

## 2013-05-10 NOTE — Progress Notes (Signed)
Patient ID: Austin Shaffer, male   DOB: 1945-03-21, 68 y.o.   MRN: [id number] Pt reported to [facility] for Repetitive Transcranial Magnetic Stimulation treatment for Major Depressive Disorder. Pt reported that he continues to experience drowsiness which abates around midday. Pt reported that he continues to experience less depression than before beginning [place] treatment. Pt tolerated tx well. Tx auto-paused 1X due to loss of coil contact. Coil contact regained and tx resumed without issue. %MT remained at 120% for the duration of tx.

## 2013-05-13 ENCOUNTER — Other Ambulatory Visit (HOSPITAL_COMMUNITY): Payer: Medicare Other | Attending: Psychiatry | Admitting: *Deleted

## 2013-05-13 VITALS — BP 146/72 | HR 60

## 2013-05-13 DIAGNOSIS — F332 Major depressive disorder, recurrent severe without psychotic features: Secondary | ICD-10-CM | POA: Diagnosis not present

## 2013-05-13 NOTE — Progress Notes (Signed)
Patient ID: Austin Shaffer, male   DOB: 1946/02/07, 68 y.o.   MRN: [id number] Pt reported to [facility] for Rapid Transcranial Magnetic Stimulation treatment for Major Depressive Disorder. Pt reported that he is still experiencing significant levels of drowsiness in the morning. Pt tolerated tx well. %MT remained at 120% for the duration of tx.

## 2013-05-14 ENCOUNTER — Other Ambulatory Visit (INDEPENDENT_AMBULATORY_CARE_PROVIDER_SITE_OTHER): Payer: Medicare Other | Admitting: *Deleted

## 2013-05-14 VITALS — BP 150/66 | HR 56 | Ht 73.0 in | Wt 244.6 lb

## 2013-05-14 DIAGNOSIS — F332 Major depressive disorder, recurrent severe without psychotic features: Secondary | ICD-10-CM | POA: Diagnosis not present

## 2013-05-14 NOTE — Progress Notes (Signed)
Patient ID: Austin Shaffer, male   DOB: 02-May-1945, 68 y.o.   MRN: [id number] Pt reported to [facility] for Repetitive Transcranial Magnetic Stimulation treatment for Major Depressive Disorder. Pt reported that he did not sleep well at all, saying, "I might have gotten 2 hours of sleep, if that." Pt endorsed feeling drowsy this morning as a result. Pt reported that he had a mild headache that lasted approximately 2 hours yesterday after TMS tx. Pt's headache resolved on its own without the use of medication. Pt reported that he had blood drawn for lab work ordered by his PCP before reporting to this clinic for rTMS tx. Pt tolerated tx well. Tx auto-paused 3X due to loss of coil contact. In each instance, coil contact regained and tx resumed without issue. %MT remained at 120% for the duration of tx. Pt completed a PHQ-9, rating a score of 12.

## 2013-05-17 ENCOUNTER — Other Ambulatory Visit (INDEPENDENT_AMBULATORY_CARE_PROVIDER_SITE_OTHER): Payer: Medicare Other | Admitting: *Deleted

## 2013-05-17 VITALS — BP 138/68 | HR 60 | Ht 73.0 in | Wt 242.8 lb

## 2013-05-17 DIAGNOSIS — F332 Major depressive disorder, recurrent severe without psychotic features: Secondary | ICD-10-CM | POA: Diagnosis not present

## 2013-05-17 NOTE — Progress Notes (Signed)
Patient ID: Austin Shaffer, male   DOB: [birth year], 68 y.o.   MRN: [id number] Pt reported to [facility] for Rapid Transcranial Magnetic Stimulation treatment for Major Depressive Disorder. Pt reported that he experienced discomfort in the treatment area, including a mild headache, on 05/15/13. Pt also experienced lightheadedness upon standing upon. These symptoms were accompanied by "nervousness." The headache and lightheadedness abaited after approximately 6 hours, but the nervousness remained. Pt experienced nervousness all day 05/16/13. Pt reported "I feel good today." Pt tolerated tx well. Tx auto-paused 1X due to loss of coil contact. Coil contact regained and tx resumed without issue. %MT remained at 120% for the duration of tx.

## 2013-05-20 ENCOUNTER — Other Ambulatory Visit (INDEPENDENT_AMBULATORY_CARE_PROVIDER_SITE_OTHER): Payer: Medicare Other | Admitting: *Deleted

## 2013-05-20 VITALS — BP 146/62 | HR 56 | Ht 73.0 in | Wt 244.2 lb

## 2013-05-20 DIAGNOSIS — F332 Major depressive disorder, recurrent severe without psychotic features: Secondary | ICD-10-CM | POA: Diagnosis not present

## 2013-05-20 NOTE — Progress Notes (Signed)
Patient ID: Austin Shaffer, male   DOB: [birth year], 68 y.o.   MRN: [id number] Pt reported to [facility] for Repetitive Transcranial Magnetic Stimulation treatment for Major Depressive Disorder. Pt reported that he remained unable to sleep well over the weekend, and he felt drowsy this morning as a result. Pt reported that he rode his bicycle yesterday for about a mile, an activity he has not taken part in for approximately 2 years. Pt completed a PHQ-9, scoring a 14. Pt tolerated tx well. %MT remained at 120% for the duration of tx.

## 2013-05-24 ENCOUNTER — Other Ambulatory Visit (INDEPENDENT_AMBULATORY_CARE_PROVIDER_SITE_OTHER): Payer: Medicare Other | Admitting: *Deleted

## 2013-05-24 VITALS — BP 148/60 | HR 56 | Ht 75.0 in | Wt 244.0 lb

## 2013-05-24 DIAGNOSIS — F332 Major depressive disorder, recurrent severe without psychotic features: Secondary | ICD-10-CM

## 2013-05-24 NOTE — Progress Notes (Signed)
Patient ID: Austin Shaffer, male   DOB: [birth year], 68 y.o.   MRN: [id number] Pt reported to [facility] for Repetitve Transcranial Magnetic Stimulation treatment for Major Depressive Disorder. This is the pt's final session in his course of rTMS tx. Pt reported that he felt depressed, nervous, and sad all day for the past two days. Pt was unsure of the trigger for these feelings. Pt verbalized "I feel better today, though." Pt completed a PHQ-9, scoring a 17. Pt completed a Beck's Depression Inventory, scoring a 28. Pt tolerated tx well. Tx auto-paused 1X due to loss of coil contact. Coil contact regained and tx resumed without issue. %MT remained at 120% for the duration of tx.

## 2013-05-27 ENCOUNTER — Telehealth (HOSPITAL_COMMUNITY): Payer: Self-pay

## 2013-05-29 NOTE — Telephone Encounter (Signed)
Called patient. Asked him to have a trial increase of trazodone to 100-150 mg.

## 2013-05-31 ENCOUNTER — Telehealth (HOSPITAL_COMMUNITY): Payer: Self-pay

## 2013-05-31 NOTE — Telephone Encounter (Signed)
Called patient. He fells groggy after trazodone and is still not getting straight sleep.  Advised melatonin 3-9 mg in addition to trazodone 100 mg.

## 2013-06-06 ENCOUNTER — Ambulatory Visit (INDEPENDENT_AMBULATORY_CARE_PROVIDER_SITE_OTHER): Payer: Medicare Other | Admitting: Psychiatry

## 2013-06-06 VITALS — BP 152/72 | HR 56 | Ht 73.0 in | Wt 243.8 lb

## 2013-06-06 DIAGNOSIS — F329 Major depressive disorder, single episode, unspecified: Secondary | ICD-10-CM

## 2013-06-06 DIAGNOSIS — F339 Major depressive disorder, recurrent, unspecified: Secondary | ICD-10-CM

## 2013-06-06 DIAGNOSIS — F410 Panic disorder [episodic paroxysmal anxiety] without agoraphobia: Secondary | ICD-10-CM

## 2013-06-07 ENCOUNTER — Encounter (HOSPITAL_COMMUNITY): Payer: Self-pay | Admitting: Psychiatry

## 2013-06-07 NOTE — Progress Notes (Signed)
Patient ID: Austin Shaffer, male   DOB: [birth year], 68 y.o.   MRN: [id number]  [facility]   Austin Shaffer [id number] 68 y.o.  Date of visit 06/06/2013 Chief Complaint: I am doing much better with my depression   History of Present Illness: HPI Comments: Mr. Desaulniers is  a 68 y/o male with a past psychiatric history significant for Maj. 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 Patient has recently completed [place]  Patient states that he's doing fairly well in regards to his depression, still struggles some with his anxiety but feels that the MS has helped him greatly. Patient as the only problem he has currently is difficulty in staying asleep. He states that he's tried the trazodone but does not help him stay asleep throughout the night. He feels that the only medication which has helped him with his sleep has been Klonopin and he will talk to his psychiatrist about restarting it at bedtime for sleep.  On a scale of 0-10, with 0 being no symptoms in 10 being the worst, patient reports his depression a 2/10 on the similar scale he reports his anxiety around a 3/10. He adds that overall he is doing well, has energy to do activities, seems to enjoy things, has motivation. His wife agrees that the patient is doing fairly well. They both adds that patient has a followup appointment with his outpatient psychiatrist  They deny any other complaints at this visit, any safety concerns.  Suicidal Ideation: Negative Plan Formed: Negative Patient has means to carry out plan: Negative  Homicidal Ideation: Negative Plan Formed: Negative Patient has means to carry out plan: Negative  Review of Systems: Review of Systems  Constitutional: Negative.  Negative for fever, chills, weight loss and malaise/fatigue.  HENT: Negative.  Negative for congestion and sore throat.   Eyes: Negative.  Negative for blurred vision.  Respiratory: Negative.  Negative for  cough, shortness of breath and wheezing.   Cardiovascular: Negative.  Negative for chest pain, palpitations and leg swelling.  Gastrointestinal: Negative.  Negative for heartburn, nausea, vomiting, abdominal pain, diarrhea and constipation.  Genitourinary: Negative.  Negative for dysuria.  Musculoskeletal: Negative.  Negative for falls, myalgias and neck pain.  Skin: Negative.   Neurological: Negative.  Negative for dizziness, tremors, sensory change, speech change, focal weakness, seizures, loss of consciousness and headaches.  Endo/Heme/Allergies: Negative.  Negative for environmental allergies.  Psychiatric/Behavioral: Negative for depression, suicidal ideas, hallucinations, memory loss and substance abuse. The patient has insomnia. The patient is not nervous/anxious.     Past Medical Family, Social History:  Past Medical History  Diagnosis Date  . Cancer   . Hyperlipemia   . Anxiety and depression   . White coat hypertension   . Anxiety   . Depression    Family History  Problem Relation Age of Onset  . Cancer Mother   . Hypertension Father   . Hyperlipidemia Father   . Hypertension Brother    History   Social History  . Marital Status: Married    Spouse Name: N/A    Number of Children: N/A  . Years of Education: N/A   Occupational History  . Not on file.   Social History Main Topics  . Smoking status: Former Smoker    Quit date: 12/13/1991  . Smokeless tobacco: Not on file  . Alcohol Use: No     Comment: Rare   . Drug Use: No  . Sexual Activity: Not on file  Other Topics Concern  . Not on file   Social History Narrative  . No narrative on file   Past psychiatric medications: Celexa, Lexapro, Zoloft, Remeron, Effexor, Wellbutrin, Abilify, Seroquel, trazodone, Cymbalta  Outpatient Encounter Prescriptions as of 06/06/2013  Medication Sig  . amLODipine (NORVASC) 2.5 MG tablet   . atenolol (TENORMIN) 25 MG tablet Take 25 mg by mouth daily.    Marland Kitchen atorvastatin  (LIPITOR) 40 MG tablet Take 40 mg by mouth daily.    Marland Kitchen FLUoxetine (PROZAC) 20 MG capsule Take 2 capsules (40 mg total) by mouth daily.  . fluticasone (FLONASE) 50 MCG/ACT nasal spray   . gabapentin (NEURONTIN) 300 MG capsule   . HYDROcodone-acetaminophen (NORCO) 10-325 MG per tablet   . lamoTRIgine (LAMICTAL) 100 MG tablet Take 1 tablet (100 mg total) by mouth daily.  Marland Kitchen losartan-hydrochlorothiazide (HYZAAR) 100-12.5 MG per tablet Take 1 tablet by mouth daily.    . traZODone (DESYREL) 50 MG tablet Take 1 tablet (50 mg total) by mouth at bedtime.    Past Psychiatric History/Hospitalization(s): Anxiety: Negative Bipolar Disorder: Negative Depression: Negative Mania: Negative Psychosis: Negative Schizophrenia: Negative Personality Disorder: Negative Hospitalization for psychiatric illness: No History of Electroconvulsive Shock Therapy: Yes Prior Suicide Attempts: Negative  Physical Exam: Constitutional: Filed Vitals:   06/06/13 1031  BP: 152/72  Pulse: 56  Height: 6\' 1"  (1.854 m)  Weight: 243 lb 12.8 oz (110.587 kg)    General Appearance: alert, oriented, no acute distress and well nourished  Musculoskeletal: Strength & Muscle Tone: within normal limits Gait & Station: normal Patient leans: Right  Psychiatric: General Appearance: Negative  Eye Contact::  Good  Speech:  Clear and Coherent and Normal Rate  Volume:  Normal  Mood:  Euthymic  Affect:  Appropriate, Congruent and Full Range  Thought Process:  Coherent, Goal Directed and Intact  Orientation:  Full (Time, Place, and Person)  Thought Content:  WDL  Suicidal Thoughts:  No  Homicidal Thoughts:  No  Memory:  Immediate;   Good Recent;   Good Remote;   Good  Judgement:  Good  Insight:  Fair  Psychomotor Activity:  Normal  Concentration:  Fair  Recall:  Fair  Akathisia:  Yes  Handed:  Right  Language and fund of knowledge: Fair  Assets:  [employer]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 Patient to return back to his outpatient psychiatrist 2. Continuing Lamictal 100 mg daily for mood stabilization depression. 3. Continue Prozac 80 mg daily for depression 4. Take trazodone 100 mg at bedtime for sleep along with 10 mg of melatonin 5  Continue medications prescribed at the primary care physician  6. Call when necessary 7. No followup required as patient has completed TMS    This visit exceeded 25 minutes as patient had a pH q. 9 done along with the Becks. Also discussed insomnia in length with patient in regards to sleep hygiene, medications. Again discussed depression, it symptomatology along with the need for continued medications Hampton Abbot, MD 06/07/2013

## 2013-06-10 ENCOUNTER — Telehealth (HOSPITAL_COMMUNITY): Payer: Self-pay | Admitting: *Deleted

## 2013-06-20 ENCOUNTER — Encounter (HOSPITAL_COMMUNITY): Payer: Self-pay | Admitting: Psychiatry

## 2013-06-20 ENCOUNTER — Ambulatory Visit (INDEPENDENT_AMBULATORY_CARE_PROVIDER_SITE_OTHER): Payer: Medicare Other | Admitting: Psychiatry

## 2013-06-20 VITALS — BP 135/55 | HR 60 | Wt 242.0 lb

## 2013-06-20 DIAGNOSIS — F329 Major depressive disorder, single episode, unspecified: Secondary | ICD-10-CM

## 2013-06-20 DIAGNOSIS — F332 Major depressive disorder, recurrent severe without psychotic features: Secondary | ICD-10-CM

## 2013-06-20 DIAGNOSIS — F410 Panic disorder [episodic paroxysmal anxiety] without agoraphobia: Secondary | ICD-10-CM

## 2013-06-20 MED ORDER — FLUOXETINE HCL 20 MG PO CAPS
60.0000 mg | ORAL_CAPSULE | Freq: Every day | ORAL | Status: DC
Start: 2013-06-20 — End: 2013-06-24

## 2013-06-20 MED ORDER — CLONAZEPAM 1 MG PO TABS: 1.0000 mg | ORAL_TABLET | Freq: Two times a day (BID) | ORAL | Status: DC | PRN

## 2013-06-20 MED ORDER — LAMOTRIGINE 100 MG PO TABS: 100.0000 mg | ORAL_TABLET | Freq: Every day | ORAL | Status: DC

## 2013-06-20 MED ORDER — LAMOTRIGINE 25 MG PO TABS: 25.0000 mg | ORAL_TABLET | Freq: Every day | ORAL | Status: DC

## 2013-06-20 NOTE — Progress Notes (Signed)
[place] Follow-up Outpatient Visit  Austin Shaffer [birth year] [id number] 68 y.o.   Chief Complaint: Follow up.  History of Present Illness: HPI Comments: Mr. 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 The patient is referred for psychiatric services for  medication management.    . Location: The patient reports he completed [place].  He reports that he continues to have difficulty with sleep. . Quality:  In the area of affective symptoms, patient appears anxious. Patient denies current suicidal ideation, intent, or plan. Patient denies current homicidal ideation, intent, or plan. Patient denies auditory hallucinations. Patient denies visual hallucinations. Patient denies symptoms of paranoia. Patient states sleep is poor even with clonazepam.  Appetite is increased. Energy level is fair. Patient endorses symptoms of anhedonia for the past 15-20 years. Patient endorses some periods of hopelessness, helplessness, and guilt.   . Severity: Depression: 7/10 (0=Very depressed; 5=Neutral; 10=Very Happy)  Anxiety- 5/10 (0=no anxiety; 5= moderate/tolerable anxiety; 10= panic attacks)  . Duration: Today he reports symptoms since 1989-90  . Timing: worse when meeting new people; with frustration lasts one hour  . Context: New people, death of brother. Seeing a new doctor or going to a doctor's appointment.  . Modifying factors: Being home, going to church.  . Associated signs and symptoms: As noted in psychiatric ROS.  Suicidal Ideation: Negative Plan Formed: Negative Patient has means to carry out plan: Negative  Homicidal Ideation: Negative Plan Formed: Negative Patient has means to carry out plan: Negative  Review of Systems: Psychiatric: Agitation: Yes Hallucination: Negative Depressed Mood: Yes Insomnia: No Hypersomnia: No Altered  Concentration: No Feels Worthless: No Grandiose Ideas: No Belief In Special Powers: No New/Increased Substance Abuse: No Compulsions: No  Neurologic: Headache: Negative Seizure: Negative Paresthesias: Negative  Suicidal Ideation: Negative Plan Formed: Negative Patient has means to carry out plan: Negative  Homicidal Ideation: Negative Plan Formed: Negative Patient has means to carry out plan: Negative  Review of Systems: Review of Systems  Constitutional: Negative.  Negative for fever, chills, weight loss and malaise/fatigue.  HENT: Negative.  Negative for congestion and sore throat.   Eyes: Negative.  Negative for blurred vision.  Respiratory: Negative.  Negative for cough, shortness of breath and wheezing.   Cardiovascular: Negative.  Negative for chest pain, palpitations and leg swelling.  Gastrointestinal: Negative.  Negative for heartburn, nausea, vomiting, abdominal pain, diarrhea and constipation.  Genitourinary: Negative.  Negative for dysuria.  Musculoskeletal: Negative.  Negative for falls, myalgias and neck pain.  Skin: Negative.   Neurological: Negative.  Negative for dizziness, tremors, sensory change, speech change, focal weakness, seizures, loss of consciousness and headaches.  Endo/Heme/Allergies: Negative.  Negative for environmental allergies.  Psychiatric/Behavioral: Negative for depression, suicidal ideas, hallucinations, memory loss and substance abuse. The patient has insomnia. The patient is not nervous/anxious.     Past Medical Family, Social History:  Past Medical History  Diagnosis Date  . Cancer   . Hyperlipemia   . Anxiety and depression   . White coat hypertension   . Anxiety   . Depression    Family History  Problem Relation Age of Onset  . Cancer Mother   . Hypertension Father   . Hyperlipidemia Father   . Hypertension Brother    History   Social History  . Marital Status: Married    Spouse Name: N/A    Number of Children: N/A  .  Years of Education: N/A   Occupational History  . Not on file.   Social History Main Topics  . Smoking status: Former Smoker    Quit date: 12/13/1991  . Smokeless tobacco: Not on file  . Alcohol Use: No     Comment: Rare   . Drug Use: No     Comment: Caffiene-3 cups coffee per day.  Marland Kitchen Sexual Activity: Not on file   Other Topics Concern  . Not on file   Social History Narrative  . No narrative on file   Past psychiatric medications: Celexa, Lexapro, Zoloft, Remeron, Effexor, Wellbutrin, Abilify, Seroquel, trazodone, Cymbalta  Outpatient Encounter Prescriptions as of 06/20/2013  Medication Sig  . amLODipine (NORVASC) 2.5 MG tablet   . atenolol (TENORMIN) 25 MG tablet Take 25 mg by mouth daily.    Marland Kitchen atorvastatin (LIPITOR) 40 MG tablet Take 40 mg by mouth daily.    Marland Kitchen FLUoxetine (PROZAC) 20 MG capsule Take 2 capsules (40 mg total) by mouth daily.  . fluticasone (FLONASE) 50 MCG/ACT nasal spray   . gabapentin (NEURONTIN) 300 MG capsule   . HYDROcodone-acetaminophen (NORCO) 10-325 MG per tablet   . lamoTRIgine (LAMICTAL) 100 MG tablet Take 1 tablet (100 mg total) by mouth daily.  Marland Kitchen losartan-hydrochlorothiazide (HYZAAR) 100-12.5 MG per tablet Take 1 tablet by mouth daily.    . traZODone (DESYREL) 50 MG tablet Take 1 tablet (50 mg total) by mouth at bedtime.    Past Psychiatric History/Hospitalization(s): Anxiety: Negative Bipolar Disorder: Negative Depression: Negative Mania: Negative Psychosis: Negative Schizophrenia: Negative Personality Disorder: Negative Hospitalization for psychiatric illness: No History of Electroconvulsive Shock Therapy: Yes Prior Suicide Attempts: Negative  Physical Exam: Constitutional: Filed Vitals:   06/20/13 0930  BP: 135/55  Pulse: 60  Weight: 242 lb (109.77 kg)    General Appearance: alert, oriented, no acute distress and well nourished Musculoskeletal: Gait & Station: normal Patient leans: Right  Psychiatric Specialty Exam: General  Appearance: Negative  Eye Contact::  Good  Speech:  Clear and Coherent and Normal Rate  Volume:  Normal  Mood:  "Not deeply depressed."  Affect:  Appropriate, Congruent and Full Range  Thought Process:  Coherent, Goal Directed and Intact  Orientation:  Full (Time, Place, and Person)  Thought Content:  WDL  Suicidal Thoughts:  No  Homicidal Thoughts:  No  Memory:  Immediate;   Good Recent;   Good Remote;   Good  Judgement:  Good  Insight:  Fair  Psychomotor Activity:  Normal  Concentration:  Fair  Recall:  Fair  Akathisia:  Yes  Handed:  Right  Language-intact  fund of knowledge: Fair  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 Affirm with the patient that the medications are taken as ordered. Patient  expressed understanding of how their medications were to be used.    Laboratory:  No labs warranted at this time.    Psychotherapy: Therapy: brief supportive therapy provided.  Discussed psychosocial stressors in detail. More than 50% of the visit was spent on individual therapy/counseling.   Medications:  Continue the following psychiatric medications as written prior to this appointment with the following changes::  a) Restart ClonazePAM (KLONOPIN) 1 MG tablet-half tablet as needed and one tablet at bedtime. b) FLUoxetine (PROZAC) 60 MG capsule c) lamoTRIgine (LAMICTAL)  100 MG tablet D) Stop trazodone 50 mg for insomnia -Risks and benefits, side effects and alternatives discussed with patient, he was given an opportunity to ask questions about her medication, illness, and treatment. All current psychiatric medications have been reviewed and discussed with the patient and adjusted as clinically appropriate. The  patient has been provided an accurate and updated list of the medications being now prescribed.   Routine PRN Medications:  Negative  Consultations: The patient was encouraged to keep all PCP and specialty clinic appointments.   Safety Concerns:   Patient told to call clinic if any problems occur. Patient advised to go to  ER  if he should develop SI/HI, side effects, or if symptoms worsen. Has crisis numbers to call if needed.    Other:   8. Patient was instructed to return to clinic in  2 months.  9. The patient was advised to call and cancel their mental health appointment within 24 hours of the appointment, if they are unable to keep the appointment, as well as the three no show and termination from clinic policy. 10. The patient expressed understanding of the plan and agrees with the above. 11. Patient informed that April 15th, 2015 would be my last day at this clinic.   Time Spent: 25 minutes  Coralyn Helling, MD 06/20/2013

## 2013-06-24 ENCOUNTER — Telehealth (HOSPITAL_COMMUNITY): Payer: Self-pay

## 2013-06-24 DIAGNOSIS — F329 Major depressive disorder, single episode, unspecified: Secondary | ICD-10-CM

## 2013-06-24 MED ORDER — FLUOXETINE HCL 20 MG PO CAPS
60.0000 mg | ORAL_CAPSULE | Freq: Every day | ORAL | Status: DC
Start: 2013-06-24 — End: 2013-12-12

## 2013-06-24 NOTE — Telephone Encounter (Signed)
Called pharmacy and fixed prescription. Attempted to call patient no answer.

## 2013-08-30 ENCOUNTER — Telehealth (HOSPITAL_COMMUNITY): Payer: Self-pay | Admitting: *Deleted

## 2013-08-30 NOTE — Telephone Encounter (Signed)
Called pt to follow up re: his depression. Administered PHQ-9 over the phone. Pt scored an 18, which is considered moderately severe depression. Discussed alternative tx options. Pt open to talk therapy. Will follow up with patient via telephone again in a month.

## 2013-09-11 ENCOUNTER — Encounter (HOSPITAL_COMMUNITY): Payer: Self-pay | Admitting: Psychiatry

## 2013-09-11 ENCOUNTER — Ambulatory Visit (INDEPENDENT_AMBULATORY_CARE_PROVIDER_SITE_OTHER): Payer: Medicare Other | Admitting: Psychiatry

## 2013-09-11 VITALS — HR 88 | Ht 73.5 in | Wt 248.0 lb

## 2013-09-11 DIAGNOSIS — F321 Major depressive disorder, single episode, moderate: Secondary | ICD-10-CM

## 2013-09-11 DIAGNOSIS — F332 Major depressive disorder, recurrent severe without psychotic features: Secondary | ICD-10-CM

## 2013-09-11 DIAGNOSIS — F410 Panic disorder [episodic paroxysmal anxiety] without agoraphobia: Secondary | ICD-10-CM

## 2013-09-11 DIAGNOSIS — F331 Major depressive disorder, recurrent, moderate: Secondary | ICD-10-CM

## 2013-09-11 MED ORDER — BUPROPION HCL 100 MG PO TABS: 100.0000 mg | ORAL_TABLET | Freq: Every morning | ORAL | Status: DC

## 2013-09-11 NOTE — Progress Notes (Signed)
Patient ID: Austin Shaffer, male   DOB: 1945-11-21, 68 y.o.   MRN: [id number]   Austin Shaffer Follow-up Outpatient Visit  Austin Shaffer [birth year] [id number] 68 y.o.  09/11/2013 Chief Complaint: Follow up.  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 The patient is referred for psychiatric services for  medication management.    . Location: The patient reports he completed [place]. Which has helped some but now feels withdrawn and fatigue. Feels depression comes and goes but he feels withdrawn. . Quality:  In the area of affective symptoms, patient appears anxious. Patient denies current suicidal ideation, intent, or plan. Patient denies current homicidal ideation, intent, or plan. Patient denies auditory hallucinations. Patient denies visual hallucinations. Patient denies symptoms of paranoia. Patient states sleep is poor even with clonazepam.  Appetite is increased. Energy level is fair. Patient endorses symptoms of anhedonia for the past 15-20 years. Patient endorses some periods of hopelessness, helplessness, and guilt.   . Severity: Depression: 5/10 (0=Very depressed; 5=Neutral; 10=Very Happy)  Anxiety- 5/10 (0=no anxiety; 5= moderate/tolerable anxiety; 10= panic attacks)  . Duration: Today he reports symptoms since 1989-90  . Timing: worse when meeting new people; with frustration lasts one hour  . Context: New people, death of brother. Seeing a new doctor or going to a doctor's appointment.  . Modifying factors: Being home, going to church. Supportive wife.   . Associated signs and symptoms: As noted in psychiatric ROS.  Suicidal Ideation: Negative Plan Formed: Negative Patient has means to carry out plan: Negative  Homicidal Ideation: Negative Plan Formed: Negative Patient has means to carry out plan:  Negative  Review of Systems: Psychiatric: Agitation: Yes Hallucination: Negative Depressed Mood: Yes Insomnia: No Hypersomnia: No Altered Concentration: No Feels Worthless: No Grandiose Ideas: No Belief In Special Powers: No New/Increased Substance Abuse: No Compulsions: No  Neurologic: Headache: Negative Seizure: Negative Paresthesias: Negative  Suicidal Ideation: Negative Plan Formed: Negative Patient has means to carry out plan: Negative  Homicidal Ideation: Negative Plan Formed: Negative Patient has means to carry out plan: Negative  Review of Systems: Review of Systems  Constitutional: Negative.  Negative for fever, chills, weight loss and malaise/fatigue.  HENT: Negative.  Negative for congestion and sore throat.   Eyes: Negative.  Negative for blurred vision.  Respiratory: Negative.  Negative for cough, shortness of breath and wheezing.   Cardiovascular: Negative.  Negative for chest pain, palpitations and leg swelling.  Gastrointestinal: Negative.  Negative for heartburn, nausea, vomiting, abdominal pain, diarrhea and constipation.  Genitourinary: Negative.  Negative for dysuria.  Musculoskeletal: Negative.  Negative for falls, myalgias and neck pain.  Skin: Negative.   Neurological: Negative.  Negative for dizziness, tremors, sensory change, speech change, focal weakness, seizures, loss of consciousness and headaches.  Endo/Heme/Allergies: Negative.  Negative for environmental allergies.  Psychiatric/Behavioral: Negative for depression, suicidal ideas, hallucinations, memory loss and substance abuse. The patient has insomnia. The patient is not nervous/anxious.     Past Medical Family, Social History:  Past Medical History  Diagnosis Date  . Cancer   . Hyperlipemia   . Anxiety and depression   . White coat hypertension   . Anxiety   . Depression   . Tinnitus    Family History  Problem Relation Age of Onset  . Cancer Mother   . Hypertension Father   .  Hyperlipidemia Father   .  Hypertension Brother    History   Social History  . Marital Status: Married    Spouse Name: N/A    Number of Children: N/A  . Years of Education: N/A   Occupational History  . Not on file.   Social History Main Topics  . Smoking status: Former Smoker    Quit date: 12/13/1991  . Smokeless tobacco: Not on file  . Alcohol Use: No     Comment: Rare   . Drug Use: No     Comment: Caffiene-3 cups coffee per day.  Marland Kitchen Sexual Activity: Not on file   Other Topics Concern  . Not on file   Social History Narrative  . No narrative on file   Past psychiatric medications: Celexa, Lexapro, Zoloft, Remeron, Effexor, Wellbutrin, Abilify, Seroquel, trazodone, Cymbalta  Outpatient Encounter Prescriptions as of 09/11/2013  Medication Sig  . amLODipine (NORVASC) 2.5 MG tablet   . atenolol (TENORMIN) 25 MG tablet Take 25 mg by mouth daily.    Marland Kitchen atorvastatin (LIPITOR) 40 MG tablet Take 40 mg by mouth daily.    Marland Kitchen buPROPion (WELLBUTRIN) 100 MG tablet Take 1 tablet (100 mg total) by mouth every morning.  . clonazePAM (KLONOPIN) 1 MG tablet Take 1 tablet (1 mg total) by mouth 2 (two) times daily as needed for anxiety. Take one-half tablet as needed in the morning, and one whole tablet at bedtime as needed.  Marland Kitchen FLUoxetine (PROZAC) 20 MG capsule Take 3 capsules (60 mg total) by mouth daily.  . fluticasone (FLONASE) 50 MCG/ACT nasal spray   . gabapentin (NEURONTIN) 300 MG capsule   . HYDROcodone-acetaminophen (NORCO) 10-325 MG per tablet   . lamoTRIgine (LAMICTAL) 100 MG tablet Take 1 tablet (100 mg total) by mouth daily.  Marland Kitchen losartan-hydrochlorothiazide (HYZAAR) 100-12.5 MG per tablet Take 1 tablet by mouth daily.    . [DISCONTINUED] lamoTRIgine (LAMICTAL) 25 MG tablet Take 1 tablet (25 mg total) by mouth daily.    Past Psychiatric History/Hospitalization(s): Anxiety: Negative Bipolar Disorder: Negative Depression: Negative Mania: Negative Psychosis:  Negative Schizophrenia: Negative Personality Disorder: Negative Hospitalization for psychiatric illness: No History of Electroconvulsive Shock Therapy: Yes Prior Suicide Attempts: Negative  Physical Exam: Constitutional: Filed Vitals:   09/11/13 0948  Pulse: 88  Height: 6' 1.5" (1.867 m)  Weight: 248 lb (112.492 kg)    General Appearance: alert, oriented, no acute distress and well nourished Musculoskeletal: Gait & Station: normal Patient leans: Right  Psychiatric Specialty Exam: General Appearance: Negative  Eye Contact::  Good  Speech:  Clear and Coherent and Normal Rate  Volume:  Normal  Mood:  "Not deeply depressed."  Affect:  Appropriate, Congruent and Full Range  Thought Process:  Coherent, Goal Directed and Intact  Orientation:  Full (Time, Place, and Person)  Thought Content:  WDL  Suicidal Thoughts:  No  Homicidal Thoughts:  No  Memory:  Immediate;   Good Recent;   Good Remote;   Good  Judgement:  Good  Insight:  Fair  Psychomotor Activity:  Normal  Concentration:  Fair  Recall:  Fair  Akathisia:  Yes  Handed:  Right  Language-intact  fund of knowledge: Fair  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 Affirm with the patient that the medications are taken as ordered. Patient  expressed understanding  of how their medications were to be used.    Laboratory:  No labs warranted at this time.    Psychotherapy: Therapy: brief supportive therapy provided.  Discussed psychosocial stressors in detail. More than 50% of the visit was spent on individual therapy/counseling.   Medications:  Continue the following psychiatric medications as written prior to this appointment with the following  changes::  a) continue clonopine has refills, takes prn.  b) FLUoxetine (PROZAC) 60 MG capsule c) lamoTRIgine (LAMICTAL) 100 MG tablet D) start wellbutrin for augmentation 100mg  . Has been on different meds in past.  -Risks and benefits, side effects and alternatives discussed with patient, he was given an opportunity to ask questions about her medication, illness, and treatment. All current psychiatric medications have been reviewed and discussed with the patient and adjusted as clinically appropriate. The patient has been provided an accurate and updated list of the medications being now prescribed.   Routine PRN Medications:  Negative  Consultations: The patient was encouraged to keep all PCP and specialty clinic appointments.   Safety Concerns:   Patient told to call clinic if any problems occur. Patient advised to go to  ER  if he should develop SI/HI, side effects, or if symptoms worsen. Has crisis numbers to call if needed.    Other:   8. Patient was instructed to return to clinic in 1 month.  9. The patient was advised to call and cancel their mental health appointment within 24 hours of the appointment, if they are unable to keep the appointment, as well as the three no show and termination from clinic policy. 10. The patient expressed understanding of the plan and agrees with the above. 11 review in one month if adding wellbutrin has helped.    Time Spent: 25 minutes  Merian Capron, MD 09/11/2013

## 2013-10-08 ENCOUNTER — Encounter (HOSPITAL_COMMUNITY): Payer: Self-pay

## 2013-10-10 ENCOUNTER — Ambulatory Visit (INDEPENDENT_AMBULATORY_CARE_PROVIDER_SITE_OTHER): Payer: Medicare Other | Admitting: Psychiatry

## 2013-10-10 DIAGNOSIS — F410 Panic disorder [episodic paroxysmal anxiety] without agoraphobia: Secondary | ICD-10-CM

## 2013-10-10 DIAGNOSIS — F321 Major depressive disorder, single episode, moderate: Secondary | ICD-10-CM

## 2013-10-10 MED ORDER — BUPROPION HCL 100 MG PO TABS: 150.0000 mg | ORAL_TABLET | Freq: Every morning | ORAL | Status: DC

## 2013-10-10 NOTE — Progress Notes (Signed)
Patient ID: Austin Shaffer, male   DOB: [birth year], 68 y.o.   MRN: [id number]   [facility] Follow-up Outpatient Visit  KIANDRE SPAGNOLO March 26, 1945 [id number] 68 y.o.  09/11/2013 Chief Complaint: Follow up.  History of Present Illness: HPI Comments: Mr. 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 The patient is referred for psychiatric services for  medication management.    . Location: The patient reports he completed [place]. Which has helped some but now feels withdrawn and fatigue. Feels depression comes and goes but he feels withdrawn last visit. We added wellbutrin 100mg  last visit. Has noticed some improvement but his sleep remains disturbed. Takes nap during the day.  . Quality:  In the area of affective symptoms, patient appears less anxious. Patient denies current suicidal ideation, intent, or plan. Patient denies current homicidal ideation, intent, or plan. Patient denies auditory hallucinations. Patient denies visual hallucinations. Patient denies symptoms of paranoia. Patient states sleep is poor even with clonazepam.  Appetite is increased. Energy level is fair. Patient endorses symptoms of anhedonia for the past 15-20 years. Patient endorses some periods of hopelessness, helplessness, and guilt.   . Severity: Depression: 6/10 (0=Very depressed; 5=Neutral; 10=Very Happy)  Anxiety- 5/10 (0=no anxiety; 5= moderate/tolerable anxiety; 10= panic attacks)  . Duration: Today he reports symptoms since 1989-90  . Timing: worse when meeting new people; with frustration lasts one hour  . Context: New people, death of brother. Seeing a new doctor or going to a doctor's appointment.  . Modifying factors: Being home, going to church. Supportive wife.   . Associated signs and symptoms: As noted in psychiatric ROS.   Neurologic: Headache: Negative Seizure:  Negative Paresthesias: Negative  Suicidal Ideation: Negative Plan Formed: Negative Patient has means to carry out plan: Negative  Homicidal Ideation: Negative Plan Formed: Negative Patient has means to carry out plan: Negative  Review of Systems: Review of Systems  Constitutional: Negative.  Negative for fever, chills, weight loss and malaise/fatigue.  HENT: Negative.  Negative for congestion and sore throat.   Eyes: Negative.  Negative for blurred vision.  Respiratory: Negative.  Negative for cough, shortness of breath and wheezing.   Cardiovascular: Negative.  Negative for chest pain, palpitations and leg swelling.  Gastrointestinal: Negative.  Negative for nausea.  Genitourinary: Negative.  Negative for dysuria.  Musculoskeletal: Negative.  Negative for falls, myalgias and neck pain.  Skin: Negative.   Neurological: Negative.  Negative for dizziness and headaches.  Endo/Heme/Allergies: Negative.   Psychiatric/Behavioral: Positive for depression. Negative for suicidal ideas, hallucinations, memory loss and substance abuse. The patient has insomnia. The patient is not nervous/anxious.     Past Medical Family, Social History:  Past Medical History  Diagnosis Date  . Cancer   . Hyperlipemia   . Anxiety and depression   . White coat hypertension   . Anxiety   . Depression   . Tinnitus    Family History  Problem Relation Age of Onset  . Cancer Mother   . Hypertension Father   . Hyperlipidemia Father   . Hypertension Brother    History   Social History  . Marital Status: Married    Spouse Name: N/A    Number of Children: N/A  . Years of Education: N/A   Occupational History  . Not on file.   Social History Main Topics  . Smoking status: Former Audiological scientist  date: 12/13/1991  . Smokeless tobacco: Not on file  . Alcohol Use: No     Comment: Rare   . Drug Use: No     Comment: Caffiene-3 cups coffee per day.  Marland Kitchen Sexual Activity: Not on file   Other Topics Concern   . Not on file   Social History Narrative  . No narrative on file   Past psychiatric medications: Celexa, Lexapro, Zoloft, Remeron, Effexor, Wellbutrin, Abilify, Seroquel, trazodone, Cymbalta  Outpatient Encounter Prescriptions as of 10/10/2013  Medication Sig  . amLODipine (NORVASC) 2.5 MG tablet   . atenolol (TENORMIN) 25 MG tablet Take 25 mg by mouth daily.    Marland Kitchen atorvastatin (LIPITOR) 40 MG tablet Take 40 mg by mouth daily.    Marland Kitchen buPROPion (WELLBUTRIN) 100 MG tablet Take 1.5 tablets (150 mg total) by mouth every morning.  . clonazePAM (KLONOPIN) 1 MG tablet Take 1 tablet (1 mg total) by mouth 2 (two) times daily as needed for anxiety. Take one-half tablet as needed in the morning, and one whole tablet at bedtime as needed.  Marland Kitchen FLUoxetine (PROZAC) 20 MG capsule Take 3 capsules (60 mg total) by mouth daily.  . fluticasone (FLONASE) 50 MCG/ACT nasal spray   . gabapentin (NEURONTIN) 300 MG capsule   . HYDROcodone-acetaminophen (NORCO) 10-325 MG per tablet   . lamoTRIgine (LAMICTAL) 100 MG tablet Take 1 tablet (100 mg total) by mouth daily.  Marland Kitchen losartan-hydrochlorothiazide (HYZAAR) 100-12.5 MG per tablet Take 1 tablet by mouth daily.    . [DISCONTINUED] buPROPion (WELLBUTRIN) 100 MG tablet Take 1 tablet (100 mg total) by mouth every morning.    Past Psychiatric History/Hospitalization(s): Anxiety: Negative Bipolar Disorder: Negative Depression: Negative Mania: Negative Psychosis: Negative Schizophrenia: Negative Personality Disorder: Negative Hospitalization for psychiatric illness: No History of Electroconvulsive Shock Therapy: Yes Prior Suicide Attempts: Negative  Physical Exam: Constitutional: There were no vitals filed for this visit.  General Appearance: alert, oriented, no acute distress and well nourished Musculoskeletal: Gait & Station: normal Patient leans: Right  Psychiatric Specialty Exam: General Appearance: Negative  Eye Contact::  Good  Speech:  Clear and  Coherent and Normal Rate  Volume:  Normal  Mood:  "Not deeply depressed."  Affect:  Appropriate, Congruent and Full Range  Thought Process:  Coherent, Goal Directed and Intact  Orientation:  Full (Time, Place, and Person)  Thought Content:  WDL  Suicidal Thoughts:  No  Homicidal Thoughts:  No  Memory:  Immediate;   Good Recent;   Good Remote;   Good  Judgement:  Good  Insight:  Fair  Psychomotor Activity:  Normal  Concentration:  Fair  Recall:  Fair  Akathisia:  Yes  Handed:  Right  Language-intact  fund of knowledge: Fair  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 Affirm with the patient that the medications are taken as ordered. Patient  expressed understanding of how their medications were to be used.    Laboratory:  No labs warranted at this time.    Psychotherapy: Therapy: brief supportive therapy provided.  Discussed psychosocial stressors in detail. More than 50% of the visit was spent on individual therapy/counseling.   Medications:  Continue the following psychiatric medications as written prior to this appointment with the following changes::  a) continue clonopine has refills, takes prn.  b) FLUoxetine (PROZAC) 60 MG capsule c) lamoTRIgine (LAMICTAL) 100 MG tablet D) further increase wellbutrin for augmentation 150mg  . Has been on different meds in past.  He has refills for meds. I will refill with increase dose of wellbutrin today.  -Risks and benefits, side effects and alternatives discussed with patient, he was given an opportunity to ask questions about her medication, illness, and treatment. All current psychiatric medications have been reviewed and discussed with the patient and adjusted as  clinically appropriate. The patient has been provided an accurate and updated list of the medications being now prescribed.   Routine PRN Medications:  Negative  Consultations: The patient was encouraged to keep all PCP and specialty clinic appointments.   Safety Concerns:   Patient told to call clinic if any problems occur. Patient advised to go to  ER  if he should develop SI/HI, side effects, or if symptoms worsen. Has crisis numbers to call if needed.    Other:   8. Patient was instructed to return to clinic in 1 month.  9. The patient was advised to call and cancel their mental health appointment within 24 hours of the appointment, if they are unable to keep the appointment, as well as the three no show and termination from clinic policy. 10. The patient expressed understanding of the plan and agrees with the above. 11 review in 2 months, or call in early for concerns. .    Time Spent: 25 minutes  Merian Capron, MD 10/10/2013

## 2013-10-14 ENCOUNTER — Ambulatory Visit (HOSPITAL_COMMUNITY): Payer: Self-pay | Admitting: Psychiatry

## 2013-11-25 ENCOUNTER — Telehealth (HOSPITAL_COMMUNITY): Payer: Self-pay | Admitting: *Deleted

## 2013-11-25 NOTE — Telephone Encounter (Signed)
Called pt to follow up re: [place] tx for MDD. Pt completed a PHQ-9 administered verbally by the Probation officer. Pt scored a 9 (mild depression). Pt stated that last week was very good, but the week before had been difficult. Pt identified going on a fishing trip to the beach to be one of the reasons last week was so good. Pt also mentioned side-effects form his current medication regimen making him feel more depressed; specifically, he feels fatigued when he takes his Prozac. Will follow up again with patient via telephone in 3 months.

## 2013-12-12 ENCOUNTER — Ambulatory Visit (INDEPENDENT_AMBULATORY_CARE_PROVIDER_SITE_OTHER): Payer: Medicare Other | Admitting: Psychiatry

## 2013-12-12 DIAGNOSIS — F332 Major depressive disorder, recurrent severe without psychotic features: Secondary | ICD-10-CM

## 2013-12-12 DIAGNOSIS — F331 Major depressive disorder, recurrent, moderate: Secondary | ICD-10-CM

## 2013-12-12 DIAGNOSIS — F410 Panic disorder [episodic paroxysmal anxiety] without agoraphobia: Secondary | ICD-10-CM

## 2013-12-12 MED ORDER — LAMOTRIGINE 100 MG PO TABS: 100.0000 mg | ORAL_TABLET | Freq: Every day | ORAL | Status: DC

## 2013-12-12 MED ORDER — FLUOXETINE HCL 20 MG PO CAPS: 60.0000 mg | ORAL_CAPSULE | Freq: Every day | ORAL | Status: DC

## 2013-12-12 MED ORDER — CLONAZEPAM 1 MG PO TABS: ORAL_TABLET | ORAL | Status: DC

## 2013-12-12 NOTE — Progress Notes (Signed)
Patient ID: Austin Shaffer, male   DOB: 1946/02/09, 68 y.o.   MRN: [id number]   [facility] Follow-up Outpatient Visit  Austin Shaffer [birth year] [id number] 68 y.o.  12/12/2013 Chief Complaint: Follow up.  History of Present Illness: HPI Comments: Mr. 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 The patient is referred for psychiatric services for  medication management.    . Location: The patient reports he completed [place]. Which has helped some but now feels withdrawn and fatigue. Feels depression comes and goes but he feels withdrawn last visit. We added wellbutrin 100mg  and increased it last visit but he now feels it makes him tired and he stopped it. Takes klonopine at night but endorses anxiety during the day. We talked about dividing the dose during the day and night. . Quality:  In the area of affective symptoms, patient appears less anxious. Patient denies current suicidal ideation, intent, or plan. Patient denies current homicidal ideation, intent, or plan. Patient denies auditory hallucinations. Patient denies visual hallucinations. Patient denies symptoms of paranoia. Patient states sleep is poor even with clonazepam.  Appetite is increased. Energy level is fair. Patient endorses symptoms of anhedonia for the past 15-20 years. Patient endorses some periods of hopelessness, helplessness, and guilt.   . Severity: Depression: 6/10 (0=Very depressed; 5=Neutral; 10=Very Happy)  Anxiety- 5/10 (0=no anxiety; 5= moderate/tolerable anxiety; 10= panic attacks)  . Duration: reports symptoms since 1989-90  . Timing: worse when meeting new people; with frustration lasts one hour  . Context: New people, death of brother. Seeing a new doctor or going to a doctor's appointment.  . Modifying factors: Being home, going to church. Supportive wife.   . Associated  signs and symptoms: As noted in psychiatric ROS.   Neurologic: Headache: Negative Seizure: Negative Paresthesias: Negative  Suicidal Ideation: Negative Plan Formed: Negative Patient has means to carry out plan: Negative  Homicidal Ideation: Negative Plan Formed: Negative Patient has means to carry out plan: Negative  Review of Systems: Review of Systems  Constitutional: Negative for fever.  HENT: Negative.   Respiratory: Negative for cough.   Cardiovascular: Negative for chest pain.  Gastrointestinal: Negative for nausea.  Musculoskeletal: Negative for falls and myalgias.  Skin: Negative for rash.  Neurological: Negative.  Negative for dizziness, tremors and headaches.  Endo/Heme/Allergies: Negative.   Psychiatric/Behavioral: Positive for depression. Negative for suicidal ideas, hallucinations, memory loss and substance abuse. The patient has insomnia. The patient is not nervous/anxious.     Past Medical Family, Social History:  Past Medical History  Diagnosis Date  . Cancer   . Hyperlipemia   . Anxiety and depression   . White coat hypertension   . Anxiety   . Depression   . Tinnitus    Family History  Problem Relation Age of Onset  . Cancer Mother   . Hypertension Father   . Hyperlipidemia Father   . Hypertension Brother    History   Social History  . Marital Status: Married    Spouse Name: N/A    Number of Children: N/A  . Years of Education: N/A   Occupational History  . Not on file.   Social History Main Topics  . Smoking status: Former Smoker    Quit date: 12/13/1991  . Smokeless tobacco: Not on file  . Alcohol Use: No     Comment: Rare   . Drug Use: No  Comment: Caffiene-3 cups coffee per day.  Marland Kitchen Sexual Activity: Not on file   Other Topics Concern  . Not on file   Social History Narrative  . No narrative on file   Past psychiatric medications: Celexa, Lexapro, Zoloft, Remeron, Effexor, Wellbutrin, Abilify, Seroquel, trazodone,  Cymbalta  Outpatient Encounter Prescriptions as of 12/12/2013  Medication Sig  . amLODipine (NORVASC) 2.5 MG tablet   . atenolol (TENORMIN) 25 MG tablet Take 25 mg by mouth daily.    Marland Kitchen atorvastatin (LIPITOR) 40 MG tablet Take 40 mg by mouth daily.    . clonazePAM (KLONOPIN) 1 MG tablet Take one-half tablet as needed during the day and one whole tablet at bedtime as needed.  Marland Kitchen FLUoxetine (PROZAC) 20 MG capsule Take 3 capsules (60 mg total) by mouth daily.  . fluticasone (FLONASE) 50 MCG/ACT nasal spray   . gabapentin (NEURONTIN) 300 MG capsule   . HYDROcodone-acetaminophen (NORCO) 10-325 MG per tablet   . lamoTRIgine (LAMICTAL) 100 MG tablet Take 1 tablet (100 mg total) by mouth daily.  Marland Kitchen losartan-hydrochlorothiazide (HYZAAR) 100-12.5 MG per tablet Take 1 tablet by mouth daily.    . [DISCONTINUED] buPROPion (WELLBUTRIN) 100 MG tablet Take 1.5 tablets (150 mg total) by mouth every morning.  . [DISCONTINUED] clonazePAM (KLONOPIN) 1 MG tablet Take 1 tablet (1 mg total) by mouth 2 (two) times daily as needed for anxiety. Take one-half tablet as needed in the morning, and one whole tablet at bedtime as needed.  . [DISCONTINUED] FLUoxetine (PROZAC) 20 MG capsule Take 3 capsules (60 mg total) by mouth daily.  . [DISCONTINUED] lamoTRIgine (LAMICTAL) 100 MG tablet Take 1 tablet (100 mg total) by mouth daily.    Past Psychiatric History/Hospitalization(s): Anxiety: Negative Bipolar Disorder: Negative Depression: Negative Mania: Negative Psychosis: Negative Schizophrenia: Negative Personality Disorder: Negative Hospitalization for psychiatric illness: No History of Electroconvulsive Shock Therapy: Yes Prior Suicide Attempts: Negative  Physical Exam: Constitutional: There were no vitals filed for this visit.  General Appearance: alert, oriented, no acute distress and well nourished Musculoskeletal: Gait & Station: normal Patient leans: Right  Psychiatric Specialty Exam: General  Appearance: Negative  Eye Contact::  Good  Speech:  Clear and Coherent and Normal Rate  Volume:  Normal  Mood:  "Not deeply depressed."  Affect:  Appropriate, Congruent and Full Range  Thought Process:  Coherent, Goal Directed and Intact  Orientation:  Full (Time, Place, and Person)  Thought Content:  WDL  Suicidal Thoughts:  No  Homicidal Thoughts:  No  Memory:  Immediate;   Good Recent;   Good Remote;   Good  Judgement:  Good  Insight:  Fair  Psychomotor Activity:  Normal  Concentration:  Fair  Recall:  Fair  Akathisia:  Yes  Handed:  Right  Language-intact  fund of knowledge: Fair  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 Affirm with the patient that the medications are taken as ordered. Patient  expressed understanding of how their medications were to be used.    Laboratory:  No labs warranted at this time.    Psychotherapy: Therapy: brief supportive therapy provided.  Discussed psychosocial stressors in detail. More than 50% of the visit was spent on individual therapy/counseling.   Medications:  Continue the following psychiatric medications as written prior to this appointment with  the following changes::  Discontinue wellbutrin. a) continue clonopine has refills, takes prn. But can take half during the day for anxiety. b) FLUoxetine (PROZAC) 60 MG capsule c) lamoTRIgine (LAMICTAL) 100 MG tablet  He has refills for meds. I will refill with increase dose of wellbutrin today.  -Risks and benefits, side effects and alternatives discussed with patient, he was given an opportunity to ask questions about her medication, illness, and treatment. All current psychiatric medications have been reviewed and  discussed with the patient and adjusted as clinically appropriate. The patient has been provided an accurate and updated list of the medications being now prescribed.   Routine PRN Medications:  Negative  Consultations: The patient was encouraged to keep all PCP and specialty clinic appointments.   Safety Concerns:   Patient told to call clinic if any problems occur. Patient advised to go to  ER  if he should develop SI/HI, side effects, or if symptoms worsen. Has crisis numbers to call if needed.    Other:   8. Patient was instructed to return to clinic in 1 month.  9. The patient was advised to call and cancel their mental health appointment within 24 hours of the appointment, if they are unable to keep the appointment, as well as the three no show and termination from clinic policy. 10. The patient expressed understanding of the plan and agrees with the above. 11 review in 2 months, or call in early for concerns. .    Time Spent: 25 minutes  Merian Capron, MD 12/12/2013

## 2014-02-26 ENCOUNTER — Telehealth (HOSPITAL_COMMUNITY): Payer: Self-pay | Admitting: *Deleted

## 2014-02-26 NOTE — Telephone Encounter (Signed)
Called pt to conduct 9 month follow up for Transcranial Magnetic Stimulation for Major Depressive Disorder. Administered PHQ-9 over the phone to quantify current severity of pt's depression. Pt scored a 10 (moderate depression). This is slightly increased from his score of 9 (mild depression) at 6 month follow up on 11/25/2013. Pt states "I'm doing better than I was, but I'm not quite where I want to be yet." Pt said will continue to pursue medication management at [facility] in [place]. Pt also interested in psychotherapy with a male therapist. Writer previously referred pt to a male therapist near pt's residence, but pt was informed that the practice does not accept the pt's insurance. Writer will investigate to find a male therapist in the pt's area who accepts pt's insurance.

## 2014-03-17 ENCOUNTER — Ambulatory Visit (HOSPITAL_COMMUNITY): Payer: Self-pay | Admitting: Psychiatry

## 2014-03-18 ENCOUNTER — Encounter (HOSPITAL_COMMUNITY): Payer: Self-pay | Admitting: Psychiatry

## 2014-03-18 ENCOUNTER — Ambulatory Visit (INDEPENDENT_AMBULATORY_CARE_PROVIDER_SITE_OTHER): Payer: Medicare Other | Admitting: Psychiatry

## 2014-03-18 ENCOUNTER — Encounter (INDEPENDENT_AMBULATORY_CARE_PROVIDER_SITE_OTHER): Payer: Self-pay

## 2014-03-18 VITALS — BP 144/70 | HR 60 | Ht 73.5 in | Wt 249.0 lb

## 2014-03-18 DIAGNOSIS — F410 Panic disorder [episodic paroxysmal anxiety] without agoraphobia: Secondary | ICD-10-CM

## 2014-03-18 DIAGNOSIS — F332 Major depressive disorder, recurrent severe without psychotic features: Secondary | ICD-10-CM

## 2014-03-18 DIAGNOSIS — F331 Major depressive disorder, recurrent, moderate: Secondary | ICD-10-CM

## 2014-03-18 MED ORDER — CLONAZEPAM 1 MG PO TABS: ORAL_TABLET | ORAL | Status: DC

## 2014-03-18 MED ORDER — LAMOTRIGINE 100 MG PO TABS: 100.0000 mg | ORAL_TABLET | Freq: Every day | ORAL | Status: DC

## 2014-03-18 MED ORDER — FLUOXETINE HCL 20 MG PO CAPS: 60.0000 mg | ORAL_CAPSULE | Freq: Every day | ORAL | Status: DC

## 2014-03-18 NOTE — Progress Notes (Signed)
Patient ID: Austin Shaffer, male   DOB: 1945/06/26, 69 y.o.   MRN: [id number]   [facility] Follow-up Outpatient Visit  Austin Shaffer [birth year] [id number] 69 y.o.  03/18/2013 Chief Complaint: Follow up.  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 The patient is referred for psychiatric services for  medication management.    . Location: The patient reports he completed [place]. Which has helped some but now feels withdrawn and fatigue. Feels depression comes and goes but he feels withdrawn last visit. We added wellbutrin 100mg  and increased it last visit but he now feels it makes him tired and he stopped it. Takes klonopine at night but endorses anxiety during the day. We talked about dividing the dose during the day and night. . Quality:  Thinks about his grandson who may be suffering from depression , it upsets him also feels down or tired during the day for few hours after taking prozac and lamictal . Not sure which is causing it. No rash reported.  In the area of affective symptoms, patient appears less anxious. Patient denies current suicidal ideation, intent, or plan. Patient denies current homicidal ideation, intent, or plan. Patient denies auditory hallucinations. Patient denies visual hallucinations. Patient denies symptoms of paranoia. Patient states sleep is poor even with clonazepam.  Appetite is increased. Energy level is fair. Patient endorses symptoms of anhedonia for the past 15-20 years. Patient endorses some periods of hopelessness, helplessness, and guilt.   . Severity: Depression: 6/10 (0=Very depressed; 5=Neutral; 10=Very Happy)  Anxiety- 5/10 (0=no anxiety; 5= moderate/tolerable anxiety; 10= panic attacks)  . Duration: reports symptoms since 1989-90  . Timing: worse when meeting new people; with  frustration lasts one hour  . Context: New people, death of brother. Seeing a new doctor or going to a doctor's appointment.  . Modifying factors: Being home, going to church. Supportive wife.   . Associated signs and symptoms: As noted in psychiatric ROS.   Neurologic: Headache: Negative Seizure: Negative Paresthesias: Negative  Suicidal Ideation: Negative Plan Formed: Negative Patient has means to carry out plan: Negative  Homicidal Ideation: Negative Plan Formed: Negative Patient has means to carry out plan: Negative  Review of Systems: Review of Systems  Constitutional: Negative for chills.  Respiratory: Negative for cough.   Cardiovascular: Negative for palpitations.  Gastrointestinal: Negative for heartburn.  Musculoskeletal: Negative for myalgias and falls.  Skin: Negative for rash.  Neurological: Negative.  Negative for dizziness and tremors.  Endo/Heme/Allergies: Negative.   Psychiatric/Behavioral: Positive for depression. Negative for suicidal ideas, hallucinations, memory loss and substance abuse. The patient has insomnia. The patient is not nervous/anxious.     Past Medical Family, Social History:  Past Medical History  Diagnosis Date  . Cancer   . Hyperlipemia   . Anxiety and depression   . White coat hypertension   . Anxiety   . Depression   . Tinnitus    Family History  Problem Relation Age of Onset  . Cancer Mother   . Hypertension Father   . Hyperlipidemia Father   . Hypertension Brother    History   Social History  . Marital Status: Married    Spouse Name: N/A    Number of Children: N/A  . Years of Education: N/A   Occupational History  . Not on file.   Social History Main Topics  .  Smoking status: Former Smoker    Quit date: 12/13/1991  . Smokeless tobacco: Not on file  . Alcohol Use: No     Comment: Rare   . Drug Use: No     Comment: Caffiene-3 cups coffee per day.  Marland Kitchen Sexual Activity: Not on file   Other Topics Concern  . Not  on file   Social History Narrative   Past psychiatric medications: Celexa, Lexapro, Zoloft, Remeron, Effexor, Wellbutrin, Abilify, Seroquel, trazodone, Cymbalta  Outpatient Encounter Prescriptions as of 03/18/2014  Medication Sig  . amLODipine (NORVASC) 2.5 MG tablet   . atenolol (TENORMIN) 25 MG tablet Take 25 mg by mouth daily.    Marland Kitchen atorvastatin (LIPITOR) 40 MG tablet Take 40 mg by mouth daily.    . clonazePAM (KLONOPIN) 1 MG tablet Take one-half tablet as needed during the day and one whole tablet at bedtime as needed.  Marland Kitchen FLUoxetine (PROZAC) 20 MG capsule Take 3 capsules (60 mg total) by mouth daily.  . fluticasone (FLONASE) 50 MCG/ACT nasal spray   . HYDROcodone-acetaminophen (NORCO) 10-325 MG per tablet   . lamoTRIgine (LAMICTAL) 100 MG tablet Take 1 tablet (100 mg total) by mouth at bedtime.  Marland Kitchen losartan-hydrochlorothiazide (HYZAAR) 100-12.5 MG per tablet Take 1 tablet by mouth daily.    . [DISCONTINUED] clonazePAM (KLONOPIN) 1 MG tablet Take one-half tablet as needed during the day and one whole tablet at bedtime as needed.  . [DISCONTINUED] FLUoxetine (PROZAC) 20 MG capsule Take 3 capsules (60 mg total) by mouth daily.  . [DISCONTINUED] lamoTRIgine (LAMICTAL) 100 MG tablet Take 1 tablet (100 mg total) by mouth daily.  . [DISCONTINUED] gabapentin (NEURONTIN) 300 MG capsule     Past Psychiatric History/Hospitalization(s): Anxiety: Negative Bipolar Disorder: Negative Depression: Negative Mania: Negative Psychosis: Negative Schizophrenia: Negative Personality Disorder: Negative Hospitalization for psychiatric illness: No History of Electroconvulsive Shock Therapy: Yes Prior Suicide Attempts: Negative  Physical Exam: Constitutional: Filed Vitals:   03/18/14 1506  BP: 144/70  Pulse: 60  Height: 6' 1.5" (1.867 m)  Weight: 249 lb (112.946 kg)    General Appearance: alert, oriented, no acute distress and well nourished Musculoskeletal: Gait & Station: normal Patient leans:  Right  Psychiatric Specialty Exam: General Appearance: Negative  Eye Contact::  Good  Speech:  Clear and Coherent and Normal Rate  Volume:  Normal  Mood:  "Not deeply depressed." few hours feeling down most days then it goes away  Affect:  Appropriate, Congruent and Full Range  Thought Process:  Coherent, Goal Directed and Intact  Orientation:  Full (Time, Place, and Person)  Thought Content:  WDL  Suicidal Thoughts:  No  Homicidal Thoughts:  No  Memory:  Immediate;   Good Recent;   Good Remote;   Good  Judgement:  Good  Insight:  Fair  Psychomotor Activity:  Normal  Concentration:  Fair  Recall:  Fair  Akathisia:  Yes  Handed:  Right  Language-intact  fund of knowledge: Fair  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 Affirm with the patient that the medications are taken as ordered. Patient  expressed understanding of how their medications were to be used.    Laboratory:  No labs warranted at this time.    Psychotherapy: Therapy: brief supportive therapy provided.  Discussed psychosocial stressors in  detail. More than 50% of the visit was spent on individual therapy/counseling.   Medications:  Continue the following psychiatric medications as written prior to this appointment with the following changes::  Discontinue wellbutrin. a) continue clonopine has refills, takes prn. But can take half during the day for anxiety. Try cutting it down slowly to avoid daytime tiredness next day.  b) FLUoxetine (PROZAC) 60 MG capsule.  c) lamoTRIgine (LAMICTAL) 100 MG tablet. Change this to night time  He has refills for meds. I will refill with increase dose of wellbutrin today.  -Risks and benefits, side effects  and alternatives discussed with patient, he was given an opportunity to ask questions about her medication, illness, and treatment. All current psychiatric medications have been reviewed and discussed with the patient and adjusted as clinically appropriate. The patient has been provided an accurate and updated list of the medications being now prescribed.   Routine PRN Medications:  Negative  Consultations: The patient was encouraged to keep all PCP and specialty clinic appointments.   Safety Concerns:   Patient told to call clinic if any problems occur. Patient advised to go to  ER  if he should develop SI/HI, side effects, or if symptoms worsen. Has crisis numbers to call if needed.    Other:   8. Patient was instructed to return to clinic in 1 month. Call back after above meds changed timing. Also need therapy with says need a male therapist. [place] contacts.  9. The patient was advised to call and cancel their mental health appointment within 24 hours of the appointment, if they are unable to keep the appointment, as well as the three no show and termination from clinic policy. 10. The patient expressed understanding of the plan and agrees with the above. 11 review in 2 months, or call in early for concerns. .    Time Spent: 25 minutes  Merian Capron, MD 03/18/2014

## 2014-05-20 ENCOUNTER — Ambulatory Visit (INDEPENDENT_AMBULATORY_CARE_PROVIDER_SITE_OTHER): Payer: Medicare Other | Admitting: Psychiatry

## 2014-05-20 ENCOUNTER — Encounter (HOSPITAL_COMMUNITY): Payer: Self-pay | Admitting: Psychiatry

## 2014-05-20 ENCOUNTER — Encounter (INDEPENDENT_AMBULATORY_CARE_PROVIDER_SITE_OTHER): Payer: Self-pay

## 2014-05-20 VITALS — BP 123/68 | HR 63 | Ht 73.5 in | Wt 247.0 lb

## 2014-05-20 DIAGNOSIS — F410 Panic disorder [episodic paroxysmal anxiety] without agoraphobia: Secondary | ICD-10-CM

## 2014-05-20 DIAGNOSIS — F332 Major depressive disorder, recurrent severe without psychotic features: Secondary | ICD-10-CM

## 2014-05-20 DIAGNOSIS — F331 Major depressive disorder, recurrent, moderate: Secondary | ICD-10-CM

## 2014-05-20 MED ORDER — LAMOTRIGINE 100 MG PO TABS: 100.0000 mg | ORAL_TABLET | Freq: Every day | ORAL | Status: DC

## 2014-05-20 MED ORDER — CLONAZEPAM 1 MG PO TABS: ORAL_TABLET | ORAL | Status: DC

## 2014-05-20 MED ORDER — FLUOXETINE HCL 20 MG PO CAPS: 60.0000 mg | ORAL_CAPSULE | Freq: Every day | ORAL | Status: DC

## 2014-05-20 NOTE — Progress Notes (Signed)
Patient ID: Austin Shaffer, male   DOB: 1946/01/13, 69 y.o.   MRN: [id number]   [facility] Follow-up Outpatient Visit  Austin Shaffer 1946-02-21 [id number] 69 y.o.  05/20/2014  Chief Complaint: Follow up.  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 The patient is referred for psychiatric services for  medication management.    . Location: The patient has completed a course of [place] in the past for depression. He been adjusting his medications for depression and increased the dose of Wellbutrin but he felt more comfortable with the Prozac and Lamictal so Wellbutrin was discontinued. Patient on Lamictal, No rash reported.  In the area of affective symptoms, patient appears less anxious. Patient denies current suicidal ideation, intent, or plan. Patient denies current homicidal ideation, intent, or plan. Patient denies auditory hallucinations. Patient denies visual hallucinations. Patient denies symptoms of paranoia. Patient states sleep is somewhat poor even on klonopin.  Appetite is fair. Energy level is fair. Patient endorses symptoms of anhedonia for the past 15-20 years. Patient endorses some periods of hopelessness, helplessness, and guilt.  He is waiting for the spring as it may help his depression. He has tried to change the timing of the medication but there is a dip during the late afternoon when he does feel more down. . Severity: Depression: 6/10 (0=Very depressed; 5=Neutral; 10=Very Happy)  Anxiety- 5/10 (0=no anxiety; 5= moderate/tolerable anxiety; 10= panic attacks)  . Duration: reports symptoms since 1989-90  . Timing: worse when meeting new people; with frustration lasts one hour  . Context: New people, death of brother. Seeing a new doctor or going to a doctor's appointment.  . Modifying factors:  Being home, going to church. Supportive wife.   . Associated signs and symptoms: As noted in psychiatric ROS.   Neurologic: Headache: Negative Seizure: Negative Paresthesias: Negative  Suicidal Ideation: Negative Plan Formed: Negative Patient has means to carry out plan: Negative  Homicidal Ideation: Negative Plan Formed: Negative Patient has means to carry out plan: Negative  Review of Systems: Review of Systems  Constitutional: Negative.   Cardiovascular: Negative for chest pain.  Skin: Negative for rash.  Neurological: Negative for tremors.  Psychiatric/Behavioral: Positive for depression.    Past Medical Family, Social History:  Past Medical History  Diagnosis Date  . Cancer   . Hyperlipemia   . Anxiety and depression   . White coat hypertension   . Anxiety   . Depression   . Tinnitus    Family History  Problem Relation Age of Onset  . Cancer Mother   . Hypertension Father   . Hyperlipidemia Father   . Hypertension Brother    History   Social History  . Marital Status: Married    Spouse Name: N/A  . Number of Children: N/A  . Years of Education: N/A   Occupational History  . Not on file.   Social History Main Topics  . Smoking status: Former Smoker    Quit date: 12/13/1991  . Smokeless tobacco: Not on file  . Alcohol Use: No     Comment: Rare   . Drug Use: No     Comment: Caffiene-3 cups coffee per day.  Marland Kitchen Sexual Activity: Not on file   Other Topics Concern  . Not on file   Social History Narrative   Past psychiatric medications: Celexa, Lexapro, Zoloft, Remeron,  Effexor, Wellbutrin, Abilify, Seroquel, trazodone, Cymbalta  Outpatient Encounter Prescriptions as of 05/20/2014  Medication Sig  . amLODipine (NORVASC) 2.5 MG tablet   . atenolol (TENORMIN) 25 MG tablet Take 25 mg by mouth daily.    Marland Kitchen atorvastatin (LIPITOR) 40 MG tablet Take 40 mg by mouth daily.    . clonazePAM (KLONOPIN) 1 MG tablet Take one-half tablet as needed during the day  and one whole tablet at bedtime as needed.  Marland Kitchen FLUoxetine (PROZAC) 20 MG capsule Take 3 capsules (60 mg total) by mouth daily.  . fluticasone (FLONASE) 50 MCG/ACT nasal spray   . HYDROcodone-acetaminophen (NORCO) 10-325 MG per tablet   . lamoTRIgine (LAMICTAL) 100 MG tablet Take 1 tablet (100 mg total) by mouth at bedtime.  Marland Kitchen losartan-hydrochlorothiazide (HYZAAR) 100-12.5 MG per tablet Take 1 tablet by mouth daily.    . [DISCONTINUED] clonazePAM (KLONOPIN) 1 MG tablet Take one-half tablet as needed during the day and one whole tablet at bedtime as needed.  . [DISCONTINUED] FLUoxetine (PROZAC) 20 MG capsule Take 3 capsules (60 mg total) by mouth daily.  . [DISCONTINUED] lamoTRIgine (LAMICTAL) 100 MG tablet Take 1 tablet (100 mg total) by mouth at bedtime.    Past Psychiatric History/Hospitalization(s): Anxiety: Negative Bipolar Disorder: Negative Depression: Negative Mania: Negative Psychosis: Negative Schizophrenia: Negative Personality Disorder: Negative Hospitalization for psychiatric illness: No History of Electroconvulsive Shock Therapy: Yes Prior Suicide Attempts: Negative  Physical Exam: Constitutional: Filed Vitals:   05/20/14 1027  BP: 123/68  Pulse: 63  Height: 6' 1.5" (1.867 m)  Weight: 247 lb (112.038 kg)    General Appearance: alert, oriented, no acute distress and well nourished Musculoskeletal: Gait & Station: normal Patient leans: Right  Psychiatric Specialty Exam: General Appearance: Negative  Eye Contact::  Good  Speech:  Clear and Coherent and Normal Rate  Volume:  Normal  Mood:  "Not deeply depressed." few hours feeling down most days then it goes away  Affect:  Appropriate, Congruent and Full Range  Thought Process:  Coherent, Goal Directed and Intact  Orientation:  Full (Time, Place, and Person)  Thought Content:  WDL  Suicidal Thoughts:  No  Homicidal Thoughts:  No  Memory:  Immediate;   Good Recent;   Good Remote;   Good  Judgement:  Good   Insight:  Fair  Psychomotor Activity:  Normal  Concentration:  Fair  Recall:  Fair  Akathisia:  Yes  Handed:  Right  Language-intact  fund of knowledge: Fair  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 Affirm with the patient that the medications are taken as ordered. Patient  expressed understanding of how their medications were to be used.    Laboratory:  No labs warranted at this time.    Psychotherapy: Therapy: brief supportive therapy provided.  Discussed psychosocial stressors in detail. More than 50% of the visit was spent on individual therapy/counseling.   Medications:  Continue the following psychiatric medications as written prior to this appointment with the following changes::  Continue Lamictal and Prozac. 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  He has refills for meds. I will refill with increase dose of wellbutrin today.  -Risks and  benefits, side effects and alternatives discussed with patient, he was given an opportunity to ask questions about her medication, illness, and treatment. All current psychiatric medications have been reviewed and discussed with the patient and adjusted as clinically appropriate. The patient has been provided an accurate and updated list of the medications being now prescribed.   Routine PRN Medications:  Negative  Consultations: The patient was encouraged to keep all PCP and specialty clinic appointments.   Safety Concerns:   Patient told to call clinic if any problems occur. Patient advised to go to  ER  if he should develop SI/HI, side effects, or if symptoms  worsen. Has crisis numbers to call if needed.    Other:   8. Patient was instructed to return to clinic in 2 month. Call back after above meds changed timing. Also need therapy with says need a male therapist. [place] contacts.  9. The patient was advised to call and cancel their mental health appointment within 24 hours of the appointment, if they are unable to keep the appointment, as well as the three no show and termination from clinic policy. 10. The patient expressed understanding of the plan and agrees with the above. 11 review in 2 months, or call in early for concerns. De Nurse, Orpah Greek, MD 05/20/2014

## 2014-05-27 ENCOUNTER — Other Ambulatory Visit (HOSPITAL_COMMUNITY): Payer: Self-pay | Admitting: Psychiatry

## 2014-08-14 ENCOUNTER — Ambulatory Visit (INDEPENDENT_AMBULATORY_CARE_PROVIDER_SITE_OTHER): Payer: Medicare Other | Admitting: Psychiatry

## 2014-08-14 ENCOUNTER — Encounter (HOSPITAL_COMMUNITY): Payer: Self-pay | Admitting: Psychiatry

## 2014-08-14 VITALS — BP 134/76 | HR 70 | Ht 73.5 in | Wt 247.0 lb

## 2014-08-14 DIAGNOSIS — F411 Generalized anxiety disorder: Secondary | ICD-10-CM | POA: Diagnosis not present

## 2014-08-14 DIAGNOSIS — F410 Panic disorder [episodic paroxysmal anxiety] without agoraphobia: Secondary | ICD-10-CM | POA: Diagnosis not present

## 2014-08-14 DIAGNOSIS — F331 Major depressive disorder, recurrent, moderate: Secondary | ICD-10-CM

## 2014-08-14 MED ORDER — FLUOXETINE HCL 20 MG PO CAPS: 80.0000 mg | ORAL_CAPSULE | Freq: Every day | ORAL | Status: DC

## 2014-08-14 MED ORDER — CLONAZEPAM 1 MG PO TABS: ORAL_TABLET | ORAL | Status: DC

## 2014-08-14 MED ORDER — CLONAZEPAM 1 MG PO TABS: ORAL_TABLET | ORAL | Status: AC

## 2014-08-14 MED ORDER — LAMOTRIGINE 100 MG PO TABS: 100.0000 mg | ORAL_TABLET | Freq: Every day | ORAL | Status: DC

## 2014-08-14 NOTE — Progress Notes (Signed)
Patient ID: Austin Shaffer, male   DOB: 16-Jul-1945, 69 y.o.   MRN: [id number]   [facility] Follow-up Outpatient Visit  Austin Shaffer [birth year] [id number] 69 y.o.  05/20/2014  Chief Complaint: Follow up.  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 GAD. The patient is referred for psychiatric services for  medication management.    . Location: The patient has completed a course of [place] in the past for depression. He been adjusting his medications for depression and increased the dose of Wellbutrin but he felt more comfortable with the Prozac and Lamictal so Wellbutrin was discontinued. Patient on Lamictal, No rash reported.   Depression. Has gone somewhat worse. Less spending time outside. Collateral information gathered by wife who was here during visit.  If wakes up down then he remains depressed during the day. Anxiety: still worries excessive, worry about his health and grandson feels he has anxiety " did i gave it to him"  . Severity: Depression: 4/10 (0=Very depressed; 5=Neutral; 10=Very Happy)  Anxiety- 5/10 (0=no anxiety; 5= moderate/tolerable anxiety; 10= panic attacks)  . Duration: reports symptoms since 1989-90  . Timing: worse when meeting new people;   Marland Kitchen Context: New people, death of brother. Seeing a new doctor or going to a doctor's appointment. Grandson anxiety.   . Modifying factors: Being home, going to church. Supportive wife.   . Associated signs and symptoms: As noted in psychiatric ROS.   Neurologic: Headache: Negative Seizure: Negative Paresthesias: Negative  Suicidal Ideation: Negative Plan Formed: Negative Patient has means to carry out plan: Negative  Homicidal Ideation: Negative Plan Formed: Negative Patient has means to carry out plan: Negative  Review of  Systems: Review of Systems  Constitutional: Negative.   Musculoskeletal: Positive for back pain.  Skin: Negative for rash.  Psychiatric/Behavioral: Positive for depression. The patient has insomnia.     Past Medical Family, Social History:  Past Medical History  Diagnosis Date  . Cancer   . Hyperlipemia   . Anxiety and depression   . White coat hypertension   . Anxiety   . Depression   . Tinnitus    Family History  Problem Relation Age of Onset  . Cancer Mother   . Hypertension Father   . Hyperlipidemia Father   . Hypertension Brother    History   Social History  . Marital Status: Married    Spouse Name: N/A  . Number of Children: N/A  . Years of Education: N/A   Occupational History  . Not on file.   Social History Main Topics  . Smoking status: Former Smoker    Quit date: 12/13/1991  . Smokeless tobacco: Not on file  . Alcohol Use: No     Comment: Rare   . Drug Use: No     Comment: Caffiene-3 cups coffee per day.  Marland Kitchen Sexual Activity: Not on file   Other Topics Concern  . Not on file   Social History Narrative   Past psychiatric medications: Celexa, Lexapro, Zoloft, Remeron, Effexor, Wellbutrin, Abilify, Seroquel, trazodone, Cymbalta  Outpatient Encounter Prescriptions as of 08/14/2014  Medication Sig  . amLODipine (NORVASC) 2.5 MG tablet   . atenolol (TENORMIN) 25 MG tablet Take 25 mg by mouth daily.    Marland Kitchen atorvastatin (LIPITOR) 40 MG tablet Take 40 mg by mouth daily.    . clonazePAM (KLONOPIN) 1  MG tablet Take one-half tablet as needed during the day and one whole tablet at bedtime as needed. Company name [employer] medically necessary.  . clonazePAM (KLONOPIN) 1 MG tablet Take one-half tablet as needed during the day and one whole tablet at bedtime as needed. Company name [employer] make medically necessary  . FLUoxetine (PROZAC) 20 MG capsule Take 4 capsules (80 mg total) by mouth daily.  . fluticasone (FLONASE) 50 MCG/ACT nasal spray   .  HYDROcodone-acetaminophen (NORCO) 10-325 MG per tablet   . lamoTRIgine (LAMICTAL) 100 MG tablet Take 1 tablet (100 mg total) by mouth at bedtime.  Marland Kitchen losartan-hydrochlorothiazide (HYZAAR) 100-12.5 MG per tablet Take 1 tablet by mouth daily.    . [DISCONTINUED] clonazePAM (KLONOPIN) 1 MG tablet Take one-half tablet as needed during the day and one whole tablet at bedtime as needed.  . [DISCONTINUED] FLUoxetine (PROZAC) 20 MG capsule Take 3 capsules (60 mg total) by mouth daily.  . [DISCONTINUED] lamoTRIgine (LAMICTAL) 100 MG tablet Take 1 tablet (100 mg total) by mouth at bedtime.   No facility-administered encounter medications on file as of 08/14/2014.    Past Psychiatric History/Hospitalization(s): Anxiety: Negative Bipolar Disorder: Negative Depression: Negative Mania: Negative Psychosis: Negative Schizophrenia: Negative Personality Disorder: Negative Hospitalization for psychiatric illness: No History of Electroconvulsive Shock Therapy: Yes Prior Suicide Attempts: Negative  Physical Exam: Constitutional: Filed Vitals:   08/14/14 1013  BP: 134/76  Pulse: 70  Height: 6' 1.5" (1.867 m)  Weight: 247 lb (112.038 kg)  SpO2: 95%    General Appearance: alert, oriented, no acute distress and well nourished Musculoskeletal: Gait & Station: normal Patient leans: Right  Psychiatric Specialty Exam: General Appearance: Negative  Eye Contact::  Good  Speech:  Clear and Coherent and Normal Rate  Volume:  Normal  Mood:   dysphoric  Affect:  constricted  Thought Process:  Coherent, Goal Directed and Intact  Orientation:  Full (Time, Place, and Person)  Thought Content:  WDL  Suicidal Thoughts:  No  Homicidal Thoughts:  No  Memory:  Immediate;   Good Recent;   Good Remote;   Good  Judgement:  Good  Insight:  Fair  Psychomotor Activity:  Normal  Concentration:  Fair  Recall:  Fair  Akathisia:  Yes  Handed:  Right  Language-intact  fund of knowledge: Fair  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 Affirm with the patient that the medications are taken as ordered. Patient  expressed understanding of how their medications were to be used.    Laboratory:  No labs warranted at this time.    Psychotherapy: Therapy: brief supportive therapy provided.  Discussed psychosocial stressors in detail. More than 50% of the visit was spent on individual therapy/counseling. Consider therapy for ongoing stress and worries.   Medications:  Continue the following psychiatric medications as written prior to this appointment with the following changes::  Increase prozac to 80 mg for depression and anxiety. incrase klonopine to 45 tablets a month  Continue lamictal for mood symptoms  will see him within few weeks and talk about other options if no imrovement.  May consider wellbutrin if needed -Risks and benefits, side effects and alternatives discussed with patient, he was given an opportunity to ask questions about her medication, illness, and treatment. All current psychiatric medications have been reviewed and  discussed with the patient and adjusted as clinically appropriate. The patient has been provided an accurate and updated list of the medications being now prescribed.   Routine PRN Medications:  Negative  Consultations: The patient was encouraged to keep all PCP and specialty clinic appointments.   Safety Concerns:   Patient told to call clinic if any problems occur. Patient advised to go to  ER  if he should develop SI/HI, side effects, or if symptoms worsen. Has crisis numbers to call if needed.    Other:   8. Patient was instructed to return to clinic in 1 month. Call back after above meds changed timing. Also need therapy with says need a male  therapist. [place] contacts.  9. The patient was advised to call and cancel their mental health appointment within 24 hours of the appointment, if they are unable to keep the appointment, as well as the three no show and termination from clinic policy. 10. The patient expressed understanding of the plan and agrees with the above. 11 review in 1 months, or call in early for concerns. .    Time spent : 25 minutes  Merian Capron, MD 08/14/2014

## 2014-09-10 ENCOUNTER — Ambulatory Visit (HOSPITAL_COMMUNITY): Payer: Self-pay | Admitting: Licensed Clinical Social Worker

## 2014-09-26 ENCOUNTER — Ambulatory Visit (HOSPITAL_COMMUNITY): Payer: Self-pay | Admitting: Psychiatry

## 2014-10-06 ENCOUNTER — Ambulatory Visit (HOSPITAL_COMMUNITY): Payer: Self-pay | Admitting: Psychiatry

## 2014-10-24 NOTE — Telephone Encounter (Signed)
Error/disregard

## 2014-11-18 ENCOUNTER — Ambulatory Visit (INDEPENDENT_AMBULATORY_CARE_PROVIDER_SITE_OTHER): Payer: Medicare Other | Admitting: Psychiatry

## 2014-11-18 ENCOUNTER — Encounter (HOSPITAL_COMMUNITY): Payer: Self-pay | Admitting: Psychiatry

## 2014-11-18 DIAGNOSIS — F410 Panic disorder [episodic paroxysmal anxiety] without agoraphobia: Secondary | ICD-10-CM

## 2014-11-18 DIAGNOSIS — F332 Major depressive disorder, recurrent severe without psychotic features: Secondary | ICD-10-CM | POA: Diagnosis not present

## 2014-11-18 DIAGNOSIS — M5416 Radiculopathy, lumbar region: Secondary | ICD-10-CM | POA: Insufficient documentation

## 2014-11-18 DIAGNOSIS — F331 Major depressive disorder, recurrent, moderate: Secondary | ICD-10-CM

## 2014-11-18 DIAGNOSIS — F411 Generalized anxiety disorder: Secondary | ICD-10-CM

## 2014-11-18 MED ORDER — CLONAZEPAM 1 MG PO TABS: ORAL_TABLET | ORAL | Status: DC

## 2014-11-18 MED ORDER — FLUOXETINE HCL 20 MG PO CAPS: 40.0000 mg | ORAL_CAPSULE | Freq: Every day | ORAL | Status: DC

## 2014-11-18 NOTE — Progress Notes (Signed)
Patient ID: Austin Shaffer, male   DOB: 1945/10/02, 69 y.o.   MRN: [id number]  [place] Follow-up Outpatient Visit  Austin Shaffer May 28, 1945 [id number] 69 y.o.  05/20/2014  Chief Complaint: Follow up.  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 GAD. The patient is referred for psychiatric services for  medication management.    . Location:  Depression. Has fluctuated. Less spending time outside. Collateral information gathered by wife who was here during visit.  If wakes up down then he remains depressed during the day. prozac is now 40mg , we increased to 60mg  but it made him feel somewhat edgy. Anxiety: still worries excessive, worry about his health and grandson feels he has anxiety " did i gave it to him" Takes klonopine for sleep and anxiety. When his back condition is worse he takes OxyContin that helps. He does understand his depression is also somewhat related to his back condition and his immobility at times.  . Severity: Depression: 5/10 (0=Very depressed; 5=Neutral; 10=Very Happy)  Anxiety- 5/10 (0=no anxiety; 5= moderate/tolerable anxiety; 10= panic attacks)  . Duration: reports symptoms since 1989-90  . Timing: worse when meeting new people;   Marland Kitchen Context: New people, death of brother. Seeing a new doctor or going to a doctor's appointment. Grandson anxiety.   . Modifying factors: Being home, going to church. Supportive wife.   . Associated signs and symptoms: As noted in psychiatric ROS.   Neurologic: Headache: Negative Seizure: Negative Paresthesias: Negative  Suicidal Ideation: Negative Plan Formed: Negative Patient has means to carry out plan: Negative  Homicidal Ideation: Negative Plan Formed: Negative Patient has means to carry out plan: Negative  Review of Systems: Review of  Systems  Constitutional: Negative for fever.  Cardiovascular: Negative for chest pain.  Musculoskeletal: Positive for back pain.  Skin: Negative for rash.  Psychiatric/Behavioral: Positive for depression. The patient has insomnia.     Past Medical Family, Social History:  Past Medical History  Diagnosis Date  . Cancer   . Hyperlipemia   . Anxiety and depression   . White coat hypertension   . Anxiety   . Depression   . Tinnitus    Family History  Problem Relation Age of Onset  . Cancer Mother   . Hypertension Father   . Hyperlipidemia Father   . Hypertension Brother    Social History   Social History  . Marital Status: Married    Spouse Name: N/A  . Number of Children: N/A  . Years of Education: N/A   Occupational History  . Not on file.   Social History Main Topics  . Smoking status: Former Smoker    Quit date: 12/13/1991  . Smokeless tobacco: Not on file  . Alcohol Use: No     Comment: Rare   . Drug Use: No     Comment: Caffiene-3 cups coffee per day.  Marland Kitchen Sexual Activity: Not on file   Other Topics Concern  . Not on file   Social History Narrative   Past psychiatric medications: Celexa, Lexapro, Zoloft, Remeron, Effexor, Wellbutrin, Abilify, Seroquel, trazodone, Cymbalta  Outpatient Encounter Prescriptions as of 11/18/2014  Medication Sig  . amLODipine (NORVASC) 2.5 MG tablet   . atenolol (TENORMIN) 25 MG tablet Take 25 mg by mouth daily.    Marland Kitchen atorvastatin (LIPITOR) 40 MG tablet Take 40 mg by mouth  daily.    . clonazePAM (KLONOPIN) 1 MG tablet Take one-half tablet as needed during the day and one whole tablet at bedtime as needed. Company name [employer] medically necessary.  . clonazePAM (KLONOPIN) 1 MG tablet Take one-half tablet as needed during the day and one whole tablet at bedtime as needed. Company name [employer] make medically necessary  . FLUoxetine (PROZAC) 20 MG capsule Take 2 capsules (40 mg total) by mouth daily.  . fluticasone (FLONASE) 50 MCG/ACT  nasal spray   . HYDROcodone-acetaminophen (NORCO) 10-325 MG per tablet   . lamoTRIgine (LAMICTAL) 100 MG tablet Take 1 tablet (100 mg total) by mouth at bedtime.  Marland Kitchen losartan-hydrochlorothiazide (HYZAAR) 100-12.5 MG per tablet Take 1 tablet by mouth daily.    . [DISCONTINUED] clonazePAM (KLONOPIN) 1 MG tablet Take one-half tablet as needed during the day and one whole tablet at bedtime as needed. Company name [employer] make medically necessary  . [DISCONTINUED] FLUoxetine (PROZAC) 20 MG capsule Take 4 capsules (80 mg total) by mouth daily.   No facility-administered encounter medications on file as of 11/18/2014.    Past Psychiatric History/Hospitalization(s): Anxiety: Negative Bipolar Disorder: Negative Depression: Negative Mania: Negative Psychosis: Negative Schizophrenia: Negative Personality Disorder: Negative Hospitalization for psychiatric illness: No History of Electroconvulsive Shock Therapy: Yes Prior Suicide Attempts: Negative  Physical Exam: Constitutional: There were no vitals filed for this visit.  General Appearance: alert, oriented, no acute distress and well nourished Musculoskeletal: Gait & Station: normal Patient leans: Right  Psychiatric Specialty Exam: General Appearance: Negative  Eye Contact::  Good  Speech:  Clear and Coherent and Normal Rate  Volume:  Normal  Mood:   Dysphoric episodically  Affect:  constricted  Thought Process:  Coherent, Goal Directed and Intact  Orientation:  Full (Time, Place, and Person)  Thought Content:  WDL  Suicidal Thoughts:  No  Homicidal Thoughts:  No  Memory:  Immediate;   Good Recent;   Good Remote;   Good  Judgement:  Good  Insight:  Fair  Psychomotor Activity:  Normal  Concentration:  Fair  Recall:  Fair  Akathisia:  Yes  Handed:  Right  Language-intact  fund of knowledge: Fair  Assets:  Armed forces logistics/support/administrative officer Desire for Improvement Financial Resources/Insurance    Assessment: Major depressive disorder,  recurrent, severe, without psychotic features Panic disorder- intermittent  GAD     Plan:   Plan of Care:  PLAN:  1. Affirm with the patient that the medications are taken as ordered. Patient  expressed understanding of how their medications were to be used.    Laboratory:  No labs warranted at this time.    Psychotherapy: Therapy: brief supportive therapy provided.  Discussed psychosocial stressors in detail. More than 50% of the visit was spent on individual therapy/counseling. Consider therapy for ongoing stress and worries.   Medications:  Continue the following psychiatric medications as written prior to this appointment with the following changes::  Decrease prozac to 40mg  as he felt uneasy at higher dose.  Continue klonopine for sleep and anxiety  Continue lamictal for mood symptoms His back condition worsens his mood and taking his pain meds help him.  will see him within few weeks and talk about other options if no imrovement.  May consider wellbutrin if needed -Risks and benefits, side effects and alternatives discussed with patient, he was given an opportunity to ask questions about her medication, illness, and treatment. All current psychiatric medications have been reviewed and discussed with the patient and adjusted as clinically appropriate.  The patient has been provided an accurate and updated list of the medications being now prescribed.   Routine PRN Medications:  Negative  Consultations: The patient was encouraged to keep all PCP and specialty clinic appointments.   Safety Concerns:   Patient told to call clinic if any problems occur. Patient advised to go to  ER  if he should develop SI/HI, side effects, or if symptoms worsen. Has crisis numbers to call if needed.    Other:   8. Patient was instructed to return to clinic in 1 month. Call back after above meds changed timing. Also need therapy with says need a male therapist. [place] contacts.  9. The patient was  advised to call and cancel their mental health appointment within 24 hours of the appointment, if they are unable to keep the appointment, as well as the three no show and termination from clinic policy. 10. The patient expressed understanding of the plan and agrees with the above. 50% time spent in collaboration of care and patient education including counseling and talking with his wife   Time spent : 25 minutes  Merian Capron, MD 11/18/2014

## 2015-01-20 ENCOUNTER — Encounter (HOSPITAL_COMMUNITY): Payer: Self-pay | Admitting: Psychiatry

## 2015-01-20 ENCOUNTER — Ambulatory Visit (INDEPENDENT_AMBULATORY_CARE_PROVIDER_SITE_OTHER): Payer: Medicare Other | Admitting: Psychiatry

## 2015-01-20 VITALS — BP 124/70 | HR 67 | Ht 73.5 in | Wt 244.0 lb

## 2015-01-20 DIAGNOSIS — F411 Generalized anxiety disorder: Secondary | ICD-10-CM

## 2015-01-20 DIAGNOSIS — F410 Panic disorder [episodic paroxysmal anxiety] without agoraphobia: Secondary | ICD-10-CM

## 2015-01-20 DIAGNOSIS — F331 Major depressive disorder, recurrent, moderate: Secondary | ICD-10-CM

## 2015-01-20 MED ORDER — LAMOTRIGINE 100 MG PO TABS: 100.0000 mg | ORAL_TABLET | Freq: Every day | ORAL | Status: DC

## 2015-01-20 MED ORDER — CLONAZEPAM 1 MG PO TABS: ORAL_TABLET | ORAL | Status: DC

## 2015-01-20 MED ORDER — FLUOXETINE HCL 20 MG PO CAPS: 40.0000 mg | ORAL_CAPSULE | Freq: Every day | ORAL | Status: DC

## 2015-01-20 NOTE — Progress Notes (Signed)
Patient ID: Austin Shaffer, male   DOB: 1946-01-12, 69 y.o.   MRN: [id number]  [place] Follow-up Outpatient Visit  Austin Shaffer 09-04-1945 [id number] 69 y.o.  05/20/2014  Chief Complaint: Follow up.  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 GAD. The patient is referred for psychiatric services for  medication management.    . Location:  Depression. Has improved and is able to spend time outside.  Last visit we cut down Prozac back to 40 mg because he was feeling agitated on 60 mg he likes to 40 mg dose. Anxiety: still worries but less excessive, including about his health and grandson who has anxiety.  Takes klonopine for sleep and anxiety. When his back condition is worse he takes OxyContin that helps. He does understand his depression is also somewhat related to his back condition and his immobility at times.  . Severity: Depression: 6/10 (0=Very depressed; 5=Neutral; 10=Very Happy)  Anxiety- 4/10 (0=no anxiety; 5= moderate/tolerable anxiety; 10= panic attacks) Panic: infrequent . Duration: reports symptoms since 1989-90  . Timing: worse when meeting new people;   Marland Kitchen Context: New people, death of brother. Seeing a new doctor or going to a doctor's appointment. Grandson anxiety.   . Modifying factors: Being home, going to church. Supportive wife.   . Associated signs and symptoms: As noted in psychiatric ROS.   Neurologic: Headache: Negative Seizure: Negative Paresthesias: Negative  Suicidal Ideation: Negative Plan Formed: Negative Patient has means to carry out plan: Negative  Homicidal Ideation: Negative Plan Formed: Negative Patient has means to carry out plan: Negative  Review of Systems: Review of Systems  Constitutional: Negative for fever.  Cardiovascular: Negative for chest  pain.  Musculoskeletal: Positive for back pain.  Skin: Negative for rash.  Neurological: Negative for tremors.    Past Medical Family, Social History:  Past Medical History  Diagnosis Date  . Cancer ([place])   . Hyperlipemia   . Anxiety and depression   . White coat hypertension   . Anxiety   . Depression   . Tinnitus    Family History  Problem Relation Age of Onset  . Cancer Mother   . Hypertension Father   . Hyperlipidemia Father   . Hypertension Brother    Social History   Social History  . Marital Status: Married    Spouse Name: N/A  . Number of Children: N/A  . Years of Education: N/A   Occupational History  . Not on file.   Social History Main Topics  . Smoking status: Former Smoker    Quit date: 12/13/1991  . Smokeless tobacco: Not on file  . Alcohol Use: No     Comment: Rare   . Drug Use: No     Comment: Caffiene-3 cups coffee per day.  Marland Kitchen Sexual Activity: Not on file   Other Topics Concern  . Not on file   Social History Narrative   Past psychiatric medications: Celexa, Lexapro, Zoloft, Remeron, Effexor, Wellbutrin, Abilify, Seroquel, trazodone, Cymbalta  Outpatient Encounter Prescriptions as of 01/20/2015  Medication Sig  . amLODipine (NORVASC) 2.5 MG tablet   . atenolol (TENORMIN) 25 MG tablet Take 25 mg by mouth daily.    Marland Kitchen atorvastatin (LIPITOR) 40 MG tablet Take 40 mg by mouth daily.    . clonazePAM (KLONOPIN) 1 MG tablet Take one-half tablet as needed during the  day and one whole tablet at bedtime as needed. Company name [employer] medically necessary.  . clonazePAM (KLONOPIN) 1 MG tablet Take one-half tablet as needed during the day and one whole tablet at bedtime as needed. Company name [employer] make medically necessary  . FLUoxetine (PROZAC) 20 MG capsule Take 2 capsules (40 mg total) by mouth daily.  . fluticasone (FLONASE) 50 MCG/ACT nasal spray   . HYDROcodone-acetaminophen (NORCO) 10-325 MG per tablet   . lamoTRIgine (LAMICTAL) 100 MG tablet Take  1 tablet (100 mg total) by mouth at bedtime.  Marland Kitchen losartan-hydrochlorothiazide (HYZAAR) 100-12.5 MG per tablet Take 1 tablet by mouth daily.    . [DISCONTINUED] clonazePAM (KLONOPIN) 1 MG tablet Take one-half tablet as needed during the day and one whole tablet at bedtime as needed. Company name [employer] make medically necessary  . [DISCONTINUED] FLUoxetine (PROZAC) 20 MG capsule Take 2 capsules (40 mg total) by mouth daily.  . [DISCONTINUED] lamoTRIgine (LAMICTAL) 100 MG tablet Take 1 tablet (100 mg total) by mouth at bedtime.   No facility-administered encounter medications on file as of 01/20/2015.    Past Psychiatric History/Hospitalization(s): Anxiety: Negative Bipolar Disorder: Negative Depression: Negative Mania: Negative Psychosis: Negative Schizophrenia: Negative Personality Disorder: Negative Hospitalization for psychiatric illness: No History of Electroconvulsive Shock Therapy: Yes Prior Suicide Attempts: Negative  Physical Exam: Constitutional: Filed Vitals:   01/20/15 0831  BP: 124/70  Pulse: 67  Height: 6' 1.5" (1.867 m)  Weight: 244 lb (110.678 kg)  SpO2: 97%    General Appearance: alert, oriented, no acute distress and well nourished Musculoskeletal: Gait & Station: normal Patient leans: Right  Psychiatric Specialty Exam: General Appearance: Negative  Eye Contact::  Good  Speech:  Clear and Coherent and Normal Rate  Volume:  Normal  Mood:  Less dysphoric  Affect:  constricted  Thought Process:  Coherent, Goal Directed and Intact  Orientation:  Full (Time, Place, and Person)  Thought Content:  WDL  Suicidal Thoughts:  No  Homicidal Thoughts:  No  Memory:  Immediate;   Good Recent;   Good Remote;   Good  Judgement:  Good  Insight:  Fair  Psychomotor Activity:  Normal  Concentration:  Fair  Recall:  Fair  Akathisia:  Yes  Handed:  Right  Language-intact  fund of knowledge: Fair  Assets:  Armed forces logistics/support/administrative officer Desire for Improvement Financial  Resources/Insurance    Assessment: Major depressive disorder, recurrent, severe, without psychotic features Panic disorder- intermittent  GAD     Plan:   Plan of Care:  PLAN:  1. Affirm with the patient that the medications are taken as ordered. Patient  expressed understanding of how their medications were to be used.    Laboratory:  No labs warranted at this time.    Psychotherapy: Therapy: brief supportive therapy provided.  Discussed psychosocial stressors in detail. More than 50% of the visit was spent on individual therapy/counseling. Consider therapy for ongoing stress and worries.   Medications:  Continue the following psychiatric medications as written prior to this appointment with the following changes::  Depression and anxiety:  prozac 40mg  as he felt uneasy at higher dose.  Continue klonopine for sleep and anxiety.   Continue lamictal for mood symptoms. He takes it at night and helps with klonopin for worries and feeling down.  His back condition worsens his mood and taking his pain meds help him.  will see him within few weeks and talk about other options if no imrovement.   -Risks and benefits, side effects and  alternatives discussed with patient, he was given an opportunity to ask questions about her medication, illness, and treatment. All current psychiatric medications have been reviewed and discussed with the patient and adjusted as clinically appropriate. The patient has been provided an accurate and updated list of the medications being now prescribed.   Routine PRN Medications:  Negative  Consultations: The patient was encouraged to keep all PCP and specialty clinic appointments.  Patient also has agree to psych drug gene testing.  Safety Concerns:   Patient told to call clinic if any problems occur. Patient advised to go to  ER  if he should develop SI/HI, side effects, or if symptoms worsen. Has crisis numbers to call if needed.    Follow up in 2 to 3  months.  The patient expressed understanding of the plan and agrees with the above. 50% time spent in collaboration of care and patient education including counseling and talking with his wife   Time spent : 25 minutes  Merian Capron, MD 01/20/2015

## 2015-04-22 ENCOUNTER — Ambulatory Visit (INDEPENDENT_AMBULATORY_CARE_PROVIDER_SITE_OTHER): Payer: Medicare Other | Admitting: Psychiatry

## 2015-04-22 ENCOUNTER — Encounter (HOSPITAL_COMMUNITY): Payer: Self-pay | Admitting: Psychiatry

## 2015-04-22 VITALS — BP 128/66 | HR 60 | Ht 73.5 in | Wt 240.0 lb

## 2015-04-22 DIAGNOSIS — F331 Major depressive disorder, recurrent, moderate: Secondary | ICD-10-CM | POA: Diagnosis not present

## 2015-04-22 DIAGNOSIS — F410 Panic disorder [episodic paroxysmal anxiety] without agoraphobia: Secondary | ICD-10-CM

## 2015-04-22 DIAGNOSIS — F411 Generalized anxiety disorder: Secondary | ICD-10-CM | POA: Diagnosis not present

## 2015-04-22 MED ORDER — FLUOXETINE HCL 20 MG PO CAPS: 40.0000 mg | ORAL_CAPSULE | Freq: Every day | ORAL | Status: DC

## 2015-04-22 MED ORDER — LAMOTRIGINE 100 MG PO TABS: 100.0000 mg | ORAL_TABLET | Freq: Every day | ORAL | Status: DC

## 2015-04-22 NOTE — Progress Notes (Signed)
Patient ID: Austin Shaffer, male   DOB: 09/08/1945, 70 y.o.   MRN: [id number]  [place] Follow-up Outpatient Visit  Austin Shaffer [birth year] [id number] 70 y.o.  04/22/2015  Chief Complaint: Follow up.  History of Present Illness: HPI Comments: Mr. 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 GAD. The patient is referred for psychiatric services for  medication management.    . Location:  Depression.baseline. Spends some time outside. He is here with his wife. Says he could be even more better but he is satisfied with meds for now. Less agitated Anxiety: still worries but less excessive, including about his health and grandson who has anxiety.  Takes klonopine for sleep and anxiety. When his back condition is worse he takes OxyContin that helps. He does understand his depression is also somewhat related to his back condition and his immobility at times.  . Severity: Depression: 6/10 (0=Very depressed; 5=Neutral; 10=Very Happy)  Anxiety- 3/10 (0=no anxiety; 5= moderate/tolerable anxiety; 10= panic attacks) Panic: infrequent . Duration: reports symptoms since 1989-90  . Timing: worse when meeting new people;   Marland Kitchen Context: New people, death of brother. Grandson anxiety.   . Modifying factors: Being home, going to church. Supportive wife.   . Associated signs and symptoms: As noted in psychiatric ROS.   Neurologic: Headache: Negative Seizure: Negative Paresthesias: Negative  Suicidal Ideation: Negative Plan Formed: Negative Patient has means to carry out plan: Negative  Homicidal Ideation: Negative Plan Formed: Negative Patient has means to carry out plan: Negative  Review of Systems: Review of Systems  Constitutional: Negative for fever.  Cardiovascular: Negative for chest pain.  Musculoskeletal: Positive for back pain.  Skin: Negative  for rash.  Neurological: Negative for tremors.  Psychiatric/Behavioral: Negative for suicidal ideas and substance abuse.    Past Medical Family, Social History:  Past Medical History  Diagnosis Date  . Cancer ([place])   . Hyperlipemia   . Anxiety and depression   . White coat hypertension   . Anxiety   . Depression   . Tinnitus    Family History  Problem Relation Age of Onset  . Cancer Mother   . Hypertension Father   . Hyperlipidemia Father   . Hypertension Brother    Social History   Social History  . Marital Status: Married    Spouse Name: N/A  . Number of Children: N/A  . Years of Education: N/A   Occupational History  . Not on file.   Social History Main Topics  . Smoking status: Former Smoker    Quit date: 12/13/1991  . Smokeless tobacco: Not on file  . Alcohol Use: No     Comment: Rare   . Drug Use: No     Comment: Caffiene-3 cups coffee per day.  Marland Kitchen Sexual Activity: Not on file   Other Topics Concern  . Not on file   Social History Narrative   Past psychiatric medications: Celexa, Lexapro, Zoloft, Remeron, Effexor, Wellbutrin, Abilify, Seroquel, trazodone, Cymbalta  Outpatient Encounter Prescriptions as of 04/22/2015  Medication Sig  . amLODipine (NORVASC) 2.5 MG tablet   . aspirin 81 MG chewable tablet Chew by mouth.  Marland Kitchen atenolol (TENORMIN) 25 MG tablet Take 25 mg by mouth daily.    Marland Kitchen atorvastatin (LIPITOR) 40 MG tablet Take 40 mg by mouth daily.    . clonazePAM (KLONOPIN) 1 MG tablet Take one-half tablet as needed  during the day and one whole tablet at bedtime as needed. Company name [employer] medically necessary.  . clonazePAM (KLONOPIN) 1 MG tablet Take one-half tablet as needed during the day and one whole tablet at bedtime as needed. Company name [employer] make medically necessary  . Coenzyme Q10 10 MG capsule Take by mouth.  Marland Kitchen FLUoxetine (PROZAC) 20 MG capsule Take 2 capsules (40 mg total) by mouth daily.  . fluticasone (FLONASE) 50 MCG/ACT nasal spray    . HYDROcodone-acetaminophen (NORCO) 10-325 MG per tablet   . lamoTRIgine (LAMICTAL) 100 MG tablet Take 1 tablet (100 mg total) by mouth at bedtime.  Marland Kitchen losartan-hydrochlorothiazide (HYZAAR) 100-12.5 MG per tablet Take 1 tablet by mouth daily.    . [DISCONTINUED] FLUoxetine (PROZAC) 20 MG capsule Take 2 capsules (40 mg total) by mouth daily.  . [DISCONTINUED] lamoTRIgine (LAMICTAL) 100 MG tablet Take 1 tablet (100 mg total) by mouth at bedtime.   No facility-administered encounter medications on file as of 04/22/2015.    Past Psychiatric History/Hospitalization(s): Anxiety: Negative Bipolar Disorder: Negative Depression: Negative Mania: Negative Psychosis: Negative Schizophrenia: Negative Personality Disorder: Negative Hospitalization for psychiatric illness: No History of Electroconvulsive Shock Therapy: Yes Prior Suicide Attempts: Negative  Physical Exam: Constitutional: Filed Vitals:   04/22/15 0833  BP: 128/66  Pulse: 60  Height: 6' 1.5" (1.867 m)  Weight: 240 lb (108.863 kg)  SpO2: 97%    General Appearance: alert, oriented, no acute distress and well nourished Musculoskeletal: Gait & Station: normal Patient leans: Right  Psychiatric Specialty Exam: General Appearance: Negative  Eye Contact::  Good  Speech:  Clear and Coherent and Normal Rate  Volume:  Normal  Mood:  Somewhat dysphoric  Affect:  constricted  Thought Process:  Coherent, Goal Directed and Intact  Orientation:  Full (Time, Place, and Person)  Thought Content:  WDL  Suicidal Thoughts:  No  Homicidal Thoughts:  No  Memory:  Immediate;   Good Recent;   Good Remote;   Good  Judgement:  Good  Insight:  Fair  Psychomotor Activity:  Normal  Concentration:  Fair  Recall:  Fair  Akathisia:  Yes  Handed:  Right  Language-intact  fund of knowledge: Fair  Assets:  Armed forces logistics/support/administrative officer Desire for Improvement Financial Resources/Insurance    Assessment: Major depressive disorder, recurrent,  severe, without psychotic features Panic disorder- intermittent  GAD     Plan:   Plan of Care:  PLAN:  1. Affirm with the patient that the medications are taken as ordered. Patient  expressed understanding of how their medications were to be used.    Laboratory:  No labs warranted at this time.    Psychotherapy: Therapy: brief supportive therapy provided.  Discussed psychosocial stressors in detail. More than 50% of the visit was spent on individual therapy/counseling. Consider therapy for ongoing stress and worries.   Meds: Continue Prozac 40 mg. Continue Lamictal for mood stabilization 100 mg. Refills given for above. Generalized anxiety disorder Klonopin he has still some leftover as he is taking one at night instead of 1-1/2. He can call in for refills. Wife agrees with the plan if needed we can change or adjust medication next visit. Medical complexity including back condition affects his mood and he takes when necessary pain medication Review of sleep hygiene Patient is a nonsmoker Follow-up in 3-4 months or earlier if needed  Merian Capron, MD 04/22/2015

## 2015-07-10 ENCOUNTER — Telehealth (HOSPITAL_COMMUNITY): Payer: Self-pay | Admitting: *Deleted

## 2015-07-10 ENCOUNTER — Other Ambulatory Visit (HOSPITAL_COMMUNITY): Payer: Self-pay | Admitting: Psychiatry

## 2015-07-10 DIAGNOSIS — F331 Major depressive disorder, recurrent, moderate: Secondary | ICD-10-CM

## 2015-07-10 MED ORDER — CLONAZEPAM 1 MG PO TABS: ORAL_TABLET | ORAL | Status: DC

## 2015-07-10 NOTE — Telephone Encounter (Signed)
Received request from pharmacy for a refill for Klonopin 1mg . Per Dr. De Nurse, rx was printed for pick up on 07/10/15. Pt was called and informed rx was ready for pickup.

## 2015-07-10 NOTE — Telephone Encounter (Signed)
Pt will need a refill for Klonopin. Please call once refill ready for pickup. Pt is schedule for a f/u appt 07/17/15.

## 2015-07-10 NOTE — Telephone Encounter (Signed)
Klonopin prescription printed for pick up

## 2015-07-17 ENCOUNTER — Ambulatory Visit (HOSPITAL_COMMUNITY): Payer: Self-pay | Admitting: Psychiatry

## 2015-08-18 ENCOUNTER — Ambulatory Visit (INDEPENDENT_AMBULATORY_CARE_PROVIDER_SITE_OTHER): Payer: Medicare Other | Admitting: Psychiatry

## 2015-08-18 ENCOUNTER — Encounter (HOSPITAL_COMMUNITY): Payer: Self-pay | Admitting: Psychiatry

## 2015-08-18 VITALS — BP 124/68 | HR 64 | Ht 73.5 in | Wt 219.0 lb

## 2015-08-18 DIAGNOSIS — F331 Major depressive disorder, recurrent, moderate: Secondary | ICD-10-CM

## 2015-08-18 DIAGNOSIS — F410 Panic disorder [episodic paroxysmal anxiety] without agoraphobia: Secondary | ICD-10-CM | POA: Diagnosis not present

## 2015-08-18 DIAGNOSIS — F411 Generalized anxiety disorder: Secondary | ICD-10-CM | POA: Diagnosis not present

## 2015-08-18 DIAGNOSIS — F321 Major depressive disorder, single episode, moderate: Secondary | ICD-10-CM

## 2015-08-18 DIAGNOSIS — F332 Major depressive disorder, recurrent severe without psychotic features: Secondary | ICD-10-CM

## 2015-08-18 MED ORDER — FLUOXETINE HCL 20 MG PO CAPS: 40.0000 mg | ORAL_CAPSULE | Freq: Every day | ORAL | Status: AC

## 2015-08-18 MED ORDER — CLONAZEPAM 1 MG PO TABS: ORAL_TABLET | ORAL | Status: AC

## 2015-08-18 MED ORDER — LAMOTRIGINE 100 MG PO TABS: 100.0000 mg | ORAL_TABLET | Freq: Every day | ORAL | Status: AC

## 2015-08-18 NOTE — Progress Notes (Signed)
Patient ID: Austin Shaffer, male   DOB: 1945/06/16, 70 y.o.   MRN: [id number]  [place] Follow-up Outpatient Visit  Austin Shaffer December 13, 1945 [id number] 70 y.o.  04/22/2015  Chief Complaint: Follow up.  History of Present Illness: HPI Comments: Austin Shaffer is  a 70 y/o male with a past psychiatric history significant for Major depressive disorder, recurrent, severe without psychosis, panic disorder without agorophobia. GAD. The patient returns for psychiatric services and  medication management.    . Location:  Depression.baseline. Spends some time outside. He is here with his wife.  Had problem not getting Tiva brand for prozac but finally got it. Other brand doesn't help much. Less agitated Anxiety: still worries but less excessive, including about his health and grandson who has anxiety. He is doing good and got honorole  Takes klonopine for sleep and anxiety. When his back condition is worse he takes OxyContin that helps. He does understand his depression is also somewhat related to his back condition and his immobility at times.  . Severity: Depression: 6/10 (0=Very depressed; 5=Neutral; 10=Very Happy)  Anxiety- 3/10 (0=no anxiety; 5= moderate/tolerable anxiety; 10= panic attacks) Panic: infrequent . Duration: reports symptoms since 1989-90  . Timing: worse when meeting new people;   Marland Kitchen Context: New people, death of brother. Grandson anxiety.   . Modifying factors: Being home, going to church. Supportive wife.   . Associated signs and symptoms: As noted in psychiatric ROS.   Neurologic: Headache: Negative Seizure: Negative Paresthesias: Negative  Suicidal Ideation: Negative Plan Formed: Negative Patient has means to carry out plan: Negative  Homicidal Ideation: Negative Plan Formed: Negative Patient has means to carry out plan: Negative  Review of Systems: Review of Systems  Constitutional: Negative for fever.  Cardiovascular: Negative for chest pain.   Musculoskeletal: Positive for back pain.  Skin: Negative for rash.  Neurological: Negative for tremors.  Psychiatric/Behavioral: Negative for depression, suicidal ideas and substance abuse.    Past Medical Family, Social History:  Past Medical History  Diagnosis Date  . Cancer ([place])   . Hyperlipemia   . Anxiety and depression   . White coat hypertension   . Anxiety   . Depression   . Tinnitus    Family History  Problem Relation Age of Onset  . Cancer Mother   . Hypertension Father   . Hyperlipidemia Father   . Hypertension Brother    Social History   Social History  . Marital Status: Married    Spouse Name: N/A  . Number of Children: N/A  . Years of Education: N/A   Occupational History  . Not on file.   Social History Main Topics  . Smoking status: Former Smoker    Quit date: 12/13/1991  . Smokeless tobacco: Not on file  . Alcohol Use: No     Comment: Rare   . Drug Use: No     Comment: Caffiene-3 cups coffee per day.  Marland Kitchen Sexual Activity: Not on file   Other Topics Concern  . Not on file   Social History Narrative   Past psychiatric medications: Celexa, Lexapro, Zoloft, Remeron, Effexor, Wellbutrin, Abilify, Seroquel, trazodone, Cymbalta  Outpatient Encounter Prescriptions as of 08/18/2015  Medication Sig  . amLODipine (NORVASC) 2.5 MG tablet   . aspirin 81 MG chewable tablet Chew by mouth.  Marland Kitchen atenolol (TENORMIN) 25 MG tablet Take 25 mg by mouth daily.    Marland Kitchen atorvastatin (LIPITOR) 40 MG tablet Take 40 mg by mouth daily.    Marland Kitchen  clonazePAM (KLONOPIN) 1 MG tablet Take one-half tablet as needed during the day and one whole tablet at bedtime as needed. Company name TEVA medically necessary.  . clonazePAM (KLONOPIN) 1 MG tablet Take one-half tablet as needed during the day and one whole tablet at bedtime as needed. Company name TEVA make medically necessary  . Coenzyme Q10 10 MG capsule Take by mouth.  Marland Kitchen FLUoxetine (PROZAC) 20 MG capsule Take 2 capsules (40 mg  total) by mouth daily. TIVA brand medically necessary  . fluticasone (FLONASE) 50 MCG/ACT nasal spray   . HYDROcodone-acetaminophen (NORCO) 10-325 MG per tablet   . lamoTRIgine (LAMICTAL) 100 MG tablet Take 1 tablet (100 mg total) by mouth at bedtime.  . [DISCONTINUED] clonazePAM (KLONOPIN) 1 MG tablet Take one-half tablet as needed during the day and one whole tablet at bedtime as needed. Company name TEVA make medically necessary  . [DISCONTINUED] FLUoxetine (PROZAC) 20 MG capsule Take 2 capsules (40 mg total) by mouth daily.  . [DISCONTINUED] lamoTRIgine (LAMICTAL) 100 MG tablet Take 1 tablet (100 mg total) by mouth at bedtime.  Marland Kitchen losartan-hydrochlorothiazide (HYZAAR) 100-12.5 MG per tablet Take 1 tablet by mouth daily. Reported on 08/18/2015   No facility-administered encounter medications on file as of 08/18/2015.    Past Psychiatric History/Hospitalization(s): Anxiety: Negative Bipolar Disorder: Negative Depression: Negative Mania: Negative Psychosis: Negative Schizophrenia: Negative Personality Disorder: Negative Hospitalization for psychiatric illness: No History of Electroconvulsive Shock Therapy: Yes Prior Suicide Attempts: Negative  Physical Exam: Constitutional: Filed Vitals:   08/18/15 0825  BP: 124/68  Pulse: 64  Height: 6' 1.5" (1.867 m)  Weight: 219 lb (99.338 kg)  SpO2: 95%    General Appearance: alert, oriented, no acute distress and well nourished Musculoskeletal: Gait & Station: normal Patient leans: Right  Psychiatric Specialty Exam: General Appearance: Negative  Eye Contact::  Good  Speech:  Clear and Coherent and Normal Rate  Volume:  Normal  Mood:  less dysphoric  Affect:  constricted  Thought Process:  Coherent, Goal Directed and Intact  Orientation:  Full (Time, Place, and Person)  Thought Content:  WDL  Suicidal Thoughts:  No  Homicidal Thoughts:  No  Memory:  Immediate;   Good Recent;   Good Remote;   Good  Judgement:  Good  Insight:   Fair  Psychomotor Activity:  Normal  Concentration:  Fair  Recall:  Fair  Akathisia:  Yes  Handed:  Right  Language-intact  fund of knowledge: Fair  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 Affirm with the patient that the medications are taken as ordered. Patient  expressed understanding of how their medications were to be used.    Laboratory:  No labs warranted at this time.    Psychotherapy: Therapy: brief supportive therapy provided.  Discussed psychosocial stressors in detail. More than 50% of the visit was spent on individual therapy/counseling. Consider therapy for ongoing stress and worries. As of now feels not needed. Grandson doing better   Meds: Continue Prozac 40 mg. TIVA brand  Continue Lamictal for mood stabilization 100 mg. Refills given for above. Generalized anxiety disorder Klonopin . Refills given. Also prozac Wife agrees with the plan if needed we can change or adjust medication next visit. Medical complexity including back condition affects his mood and he takes when necessary pain medication Review of  sleep hygiene Patient is a nonsmoker Wife was here and collaborated with info and treatment.  Follow-up in 3-4 months or earlier if needed Time spent: 25 minutes.  Merian Capron, MD 08/18/2015

## 2015-09-30 ENCOUNTER — Other Ambulatory Visit (HOSPITAL_COMMUNITY): Payer: Self-pay | Admitting: Psychiatry

## 2015-09-30 NOTE — Telephone Encounter (Signed)
Received fax from [facility] in [facility] requesting a refill for Prozac 20mg . Per Dr. De Nurse, refill request is denied. Pt has request refill too early. Pt received a printed rx for Prozac 20mg , #60 w/ 2 refills on 08/18/15. Faxed response to pharmacy at [phone]. Pt is schedule for a f/u appt on 11/10/15. Called and informed pt of refill status. Pt shows understanding.

## 2015-11-10 ENCOUNTER — Ambulatory Visit (HOSPITAL_COMMUNITY): Payer: Self-pay | Admitting: Psychiatry
